# Patient Record
Sex: Male | Born: 1965 | Race: Black or African American | Hispanic: No | Marital: Single | Smoking: Never smoker
Health system: Southern US, Community
[De-identification: ages and names within clinical notes are randomized; demographics above are authoritative.]

## PROBLEM LIST (undated history)

## (undated) DIAGNOSIS — M1990 Unspecified osteoarthritis, unspecified site: Secondary | ICD-10-CM

## (undated) DIAGNOSIS — J45909 Unspecified asthma, uncomplicated: Secondary | ICD-10-CM

---

## 1994-05-16 HISTORY — PX: ANTERIOR CERVICAL DECOMP/DISCECTOMY FUSION: SHX1161

## 1997-09-22 ENCOUNTER — Ambulatory Visit (HOSPITAL_COMMUNITY): Admission: RE | Admit: 1997-09-22 | Discharge: 1997-09-22 | Payer: Self-pay | Admitting: *Deleted

## 1997-09-24 ENCOUNTER — Inpatient Hospital Stay (HOSPITAL_COMMUNITY): Admission: RE | Admit: 1997-09-24 | Discharge: 1997-09-29 | Payer: Self-pay | Admitting: Neurosurgery

## 1998-12-12 ENCOUNTER — Encounter: Payer: Self-pay | Admitting: Neurosurgery

## 1998-12-12 ENCOUNTER — Ambulatory Visit (HOSPITAL_COMMUNITY): Admission: RE | Admit: 1998-12-12 | Discharge: 1998-12-12 | Payer: Self-pay | Admitting: Neurosurgery

## 2016-02-24 DIAGNOSIS — J45909 Unspecified asthma, uncomplicated: Secondary | ICD-10-CM | POA: Diagnosis not present

## 2016-02-24 DIAGNOSIS — R0602 Shortness of breath: Secondary | ICD-10-CM | POA: Diagnosis not present

## 2016-02-24 DIAGNOSIS — I10 Essential (primary) hypertension: Secondary | ICD-10-CM | POA: Diagnosis not present

## 2016-02-24 DIAGNOSIS — R05 Cough: Secondary | ICD-10-CM | POA: Diagnosis not present

## 2016-02-24 DIAGNOSIS — R0902 Hypoxemia: Secondary | ICD-10-CM | POA: Diagnosis not present

## 2016-02-24 DIAGNOSIS — G8220 Paraplegia, unspecified: Secondary | ICD-10-CM | POA: Diagnosis not present

## 2016-02-24 DIAGNOSIS — E099 Drug or chemical induced diabetes mellitus without complications: Secondary | ICD-10-CM | POA: Diagnosis not present

## 2016-02-24 DIAGNOSIS — S12400S Unspecified displaced fracture of fifth cervical vertebra, sequela: Secondary | ICD-10-CM | POA: Diagnosis not present

## 2016-02-24 DIAGNOSIS — Z7722 Contact with and (suspected) exposure to environmental tobacco smoke (acute) (chronic): Secondary | ICD-10-CM | POA: Diagnosis not present

## 2016-02-24 DIAGNOSIS — R079 Chest pain, unspecified: Secondary | ICD-10-CM | POA: Diagnosis not present

## 2016-02-24 DIAGNOSIS — J45901 Unspecified asthma with (acute) exacerbation: Secondary | ICD-10-CM | POA: Diagnosis not present

## 2016-02-25 DIAGNOSIS — J45998 Other asthma: Secondary | ICD-10-CM | POA: Diagnosis not present

## 2016-02-26 DIAGNOSIS — R000 Tachycardia, unspecified: Secondary | ICD-10-CM | POA: Diagnosis not present

## 2016-02-26 DIAGNOSIS — J45909 Unspecified asthma, uncomplicated: Secondary | ICD-10-CM | POA: Diagnosis not present

## 2016-02-26 DIAGNOSIS — Z23 Encounter for immunization: Secondary | ICD-10-CM | POA: Diagnosis not present

## 2016-02-26 DIAGNOSIS — R0603 Acute respiratory distress: Secondary | ICD-10-CM | POA: Diagnosis not present

## 2016-02-26 DIAGNOSIS — J45901 Unspecified asthma with (acute) exacerbation: Secondary | ICD-10-CM | POA: Diagnosis present

## 2016-02-26 DIAGNOSIS — T380X5A Adverse effect of glucocorticoids and synthetic analogues, initial encounter: Secondary | ICD-10-CM | POA: Diagnosis not present

## 2016-02-26 DIAGNOSIS — J45998 Other asthma: Secondary | ICD-10-CM | POA: Diagnosis not present

## 2016-02-26 DIAGNOSIS — R0902 Hypoxemia: Secondary | ICD-10-CM | POA: Diagnosis present

## 2016-02-26 DIAGNOSIS — S12400S Unspecified displaced fracture of fifth cervical vertebra, sequela: Secondary | ICD-10-CM | POA: Diagnosis not present

## 2016-02-26 DIAGNOSIS — E099 Drug or chemical induced diabetes mellitus without complications: Secondary | ICD-10-CM | POA: Diagnosis not present

## 2016-02-26 DIAGNOSIS — Z7722 Contact with and (suspected) exposure to environmental tobacco smoke (acute) (chronic): Secondary | ICD-10-CM | POA: Diagnosis present

## 2016-02-26 DIAGNOSIS — G8220 Paraplegia, unspecified: Secondary | ICD-10-CM | POA: Diagnosis present

## 2016-03-11 DIAGNOSIS — J45909 Unspecified asthma, uncomplicated: Secondary | ICD-10-CM | POA: Diagnosis not present

## 2016-03-11 DIAGNOSIS — R739 Hyperglycemia, unspecified: Secondary | ICD-10-CM | POA: Diagnosis not present

## 2016-03-11 DIAGNOSIS — T380X5A Adverse effect of glucocorticoids and synthetic analogues, initial encounter: Secondary | ICD-10-CM | POA: Diagnosis not present

## 2016-03-11 DIAGNOSIS — Z299 Encounter for prophylactic measures, unspecified: Secondary | ICD-10-CM | POA: Diagnosis not present

## 2016-03-11 DIAGNOSIS — M5030 Other cervical disc degeneration, unspecified cervical region: Secondary | ICD-10-CM | POA: Diagnosis not present

## 2016-03-25 DIAGNOSIS — Z1211 Encounter for screening for malignant neoplasm of colon: Secondary | ICD-10-CM | POA: Diagnosis not present

## 2016-03-25 DIAGNOSIS — Z299 Encounter for prophylactic measures, unspecified: Secondary | ICD-10-CM | POA: Diagnosis not present

## 2016-03-25 DIAGNOSIS — Z1389 Encounter for screening for other disorder: Secondary | ICD-10-CM | POA: Diagnosis not present

## 2016-03-25 DIAGNOSIS — Z0000 Encounter for general adult medical examination without abnormal findings: Secondary | ICD-10-CM | POA: Diagnosis not present

## 2016-03-25 DIAGNOSIS — Z7189 Other specified counseling: Secondary | ICD-10-CM | POA: Diagnosis not present

## 2016-06-20 DIAGNOSIS — Z87828 Personal history of other (healed) physical injury and trauma: Secondary | ICD-10-CM | POA: Diagnosis not present

## 2016-06-20 DIAGNOSIS — R0602 Shortness of breath: Secondary | ICD-10-CM | POA: Diagnosis not present

## 2016-06-20 DIAGNOSIS — J9801 Acute bronchospasm: Secondary | ICD-10-CM | POA: Diagnosis not present

## 2016-06-20 DIAGNOSIS — Z794 Long term (current) use of insulin: Secondary | ICD-10-CM | POA: Diagnosis not present

## 2016-09-22 DIAGNOSIS — Z789 Other specified health status: Secondary | ICD-10-CM | POA: Diagnosis not present

## 2016-09-22 DIAGNOSIS — Z713 Dietary counseling and surveillance: Secondary | ICD-10-CM | POA: Diagnosis not present

## 2016-09-22 DIAGNOSIS — Z299 Encounter for prophylactic measures, unspecified: Secondary | ICD-10-CM | POA: Diagnosis not present

## 2016-09-22 DIAGNOSIS — Z6825 Body mass index (BMI) 25.0-25.9, adult: Secondary | ICD-10-CM | POA: Diagnosis not present

## 2016-09-22 DIAGNOSIS — J45909 Unspecified asthma, uncomplicated: Secondary | ICD-10-CM | POA: Diagnosis not present

## 2016-12-06 DIAGNOSIS — J40 Bronchitis, not specified as acute or chronic: Secondary | ICD-10-CM | POA: Diagnosis not present

## 2016-12-06 DIAGNOSIS — J4541 Moderate persistent asthma with (acute) exacerbation: Secondary | ICD-10-CM | POA: Diagnosis not present

## 2016-12-06 DIAGNOSIS — J209 Acute bronchitis, unspecified: Secondary | ICD-10-CM | POA: Diagnosis not present

## 2016-12-06 DIAGNOSIS — R05 Cough: Secondary | ICD-10-CM | POA: Diagnosis not present

## 2016-12-06 DIAGNOSIS — J45901 Unspecified asthma with (acute) exacerbation: Secondary | ICD-10-CM | POA: Diagnosis not present

## 2017-04-07 DIAGNOSIS — R5383 Other fatigue: Secondary | ICD-10-CM | POA: Diagnosis not present

## 2017-04-07 DIAGNOSIS — Z1339 Encounter for screening examination for other mental health and behavioral disorders: Secondary | ICD-10-CM | POA: Diagnosis not present

## 2017-04-07 DIAGNOSIS — Z1331 Encounter for screening for depression: Secondary | ICD-10-CM | POA: Diagnosis not present

## 2017-04-07 DIAGNOSIS — Z6825 Body mass index (BMI) 25.0-25.9, adult: Secondary | ICD-10-CM | POA: Diagnosis not present

## 2017-04-07 DIAGNOSIS — Z79899 Other long term (current) drug therapy: Secondary | ICD-10-CM | POA: Diagnosis not present

## 2017-04-07 DIAGNOSIS — Z0000 Encounter for general adult medical examination without abnormal findings: Secondary | ICD-10-CM | POA: Diagnosis not present

## 2017-04-07 DIAGNOSIS — Z125 Encounter for screening for malignant neoplasm of prostate: Secondary | ICD-10-CM | POA: Diagnosis not present

## 2017-04-07 DIAGNOSIS — Z1211 Encounter for screening for malignant neoplasm of colon: Secondary | ICD-10-CM | POA: Diagnosis not present

## 2017-04-07 DIAGNOSIS — Z7189 Other specified counseling: Secondary | ICD-10-CM | POA: Diagnosis not present

## 2017-04-07 DIAGNOSIS — Z299 Encounter for prophylactic measures, unspecified: Secondary | ICD-10-CM | POA: Diagnosis not present

## 2017-09-05 DIAGNOSIS — M1710 Unilateral primary osteoarthritis, unspecified knee: Secondary | ICD-10-CM | POA: Diagnosis not present

## 2017-09-05 DIAGNOSIS — Z789 Other specified health status: Secondary | ICD-10-CM | POA: Diagnosis not present

## 2017-09-05 DIAGNOSIS — Z6823 Body mass index (BMI) 23.0-23.9, adult: Secondary | ICD-10-CM | POA: Diagnosis not present

## 2017-09-05 DIAGNOSIS — Z299 Encounter for prophylactic measures, unspecified: Secondary | ICD-10-CM | POA: Diagnosis not present

## 2017-09-05 DIAGNOSIS — J45909 Unspecified asthma, uncomplicated: Secondary | ICD-10-CM | POA: Diagnosis not present

## 2017-12-06 DIAGNOSIS — Z299 Encounter for prophylactic measures, unspecified: Secondary | ICD-10-CM | POA: Diagnosis not present

## 2017-12-06 DIAGNOSIS — M25562 Pain in left knee: Secondary | ICD-10-CM | POA: Diagnosis not present

## 2017-12-06 DIAGNOSIS — Z6822 Body mass index (BMI) 22.0-22.9, adult: Secondary | ICD-10-CM | POA: Diagnosis not present

## 2017-12-20 DIAGNOSIS — J449 Chronic obstructive pulmonary disease, unspecified: Secondary | ICD-10-CM | POA: Diagnosis not present

## 2017-12-20 DIAGNOSIS — Z6822 Body mass index (BMI) 22.0-22.9, adult: Secondary | ICD-10-CM | POA: Diagnosis not present

## 2017-12-20 DIAGNOSIS — Z299 Encounter for prophylactic measures, unspecified: Secondary | ICD-10-CM | POA: Diagnosis not present

## 2017-12-20 DIAGNOSIS — Z713 Dietary counseling and surveillance: Secondary | ICD-10-CM | POA: Diagnosis not present

## 2017-12-20 DIAGNOSIS — M25561 Pain in right knee: Secondary | ICD-10-CM | POA: Diagnosis not present

## 2018-01-03 DIAGNOSIS — M1711 Unilateral primary osteoarthritis, right knee: Secondary | ICD-10-CM | POA: Diagnosis not present

## 2018-01-03 DIAGNOSIS — M25561 Pain in right knee: Secondary | ICD-10-CM | POA: Diagnosis not present

## 2018-01-10 DIAGNOSIS — M25561 Pain in right knee: Secondary | ICD-10-CM | POA: Diagnosis not present

## 2018-01-10 DIAGNOSIS — M1711 Unilateral primary osteoarthritis, right knee: Secondary | ICD-10-CM | POA: Diagnosis not present

## 2018-01-17 DIAGNOSIS — M1711 Unilateral primary osteoarthritis, right knee: Secondary | ICD-10-CM | POA: Diagnosis not present

## 2018-01-17 DIAGNOSIS — M25561 Pain in right knee: Secondary | ICD-10-CM | POA: Diagnosis not present

## 2018-01-24 DIAGNOSIS — M25561 Pain in right knee: Secondary | ICD-10-CM | POA: Diagnosis not present

## 2018-01-24 DIAGNOSIS — M1711 Unilateral primary osteoarthritis, right knee: Secondary | ICD-10-CM | POA: Diagnosis not present

## 2018-01-31 DIAGNOSIS — M25561 Pain in right knee: Secondary | ICD-10-CM | POA: Diagnosis not present

## 2018-01-31 DIAGNOSIS — M1711 Unilateral primary osteoarthritis, right knee: Secondary | ICD-10-CM | POA: Diagnosis not present

## 2018-02-14 DIAGNOSIS — M25561 Pain in right knee: Secondary | ICD-10-CM | POA: Diagnosis not present

## 2018-02-14 DIAGNOSIS — M1711 Unilateral primary osteoarthritis, right knee: Secondary | ICD-10-CM | POA: Diagnosis not present

## 2018-02-21 DIAGNOSIS — M25561 Pain in right knee: Secondary | ICD-10-CM | POA: Diagnosis not present

## 2018-04-16 DIAGNOSIS — Z1339 Encounter for screening examination for other mental health and behavioral disorders: Secondary | ICD-10-CM | POA: Diagnosis not present

## 2018-04-16 DIAGNOSIS — Z0000 Encounter for general adult medical examination without abnormal findings: Secondary | ICD-10-CM | POA: Diagnosis not present

## 2018-04-16 DIAGNOSIS — Z299 Encounter for prophylactic measures, unspecified: Secondary | ICD-10-CM | POA: Diagnosis not present

## 2018-04-16 DIAGNOSIS — E7800 Pure hypercholesterolemia, unspecified: Secondary | ICD-10-CM | POA: Diagnosis not present

## 2018-04-16 DIAGNOSIS — Z6822 Body mass index (BMI) 22.0-22.9, adult: Secondary | ICD-10-CM | POA: Diagnosis not present

## 2018-04-16 DIAGNOSIS — R5383 Other fatigue: Secondary | ICD-10-CM | POA: Diagnosis not present

## 2018-04-16 DIAGNOSIS — Z1211 Encounter for screening for malignant neoplasm of colon: Secondary | ICD-10-CM | POA: Diagnosis not present

## 2018-04-16 DIAGNOSIS — J449 Chronic obstructive pulmonary disease, unspecified: Secondary | ICD-10-CM | POA: Diagnosis not present

## 2018-04-16 DIAGNOSIS — Z7189 Other specified counseling: Secondary | ICD-10-CM | POA: Diagnosis not present

## 2018-04-16 DIAGNOSIS — Z1331 Encounter for screening for depression: Secondary | ICD-10-CM | POA: Diagnosis not present

## 2018-04-18 DIAGNOSIS — E7800 Pure hypercholesterolemia, unspecified: Secondary | ICD-10-CM | POA: Diagnosis not present

## 2018-04-18 DIAGNOSIS — Z79899 Other long term (current) drug therapy: Secondary | ICD-10-CM | POA: Diagnosis not present

## 2018-04-18 DIAGNOSIS — R5383 Other fatigue: Secondary | ICD-10-CM | POA: Diagnosis not present

## 2018-04-18 DIAGNOSIS — Z125 Encounter for screening for malignant neoplasm of prostate: Secondary | ICD-10-CM | POA: Diagnosis not present

## 2018-05-22 DIAGNOSIS — M1712 Unilateral primary osteoarthritis, left knee: Secondary | ICD-10-CM | POA: Diagnosis not present

## 2018-05-22 DIAGNOSIS — M25562 Pain in left knee: Secondary | ICD-10-CM | POA: Diagnosis not present

## 2018-05-22 DIAGNOSIS — M170 Bilateral primary osteoarthritis of knee: Secondary | ICD-10-CM | POA: Diagnosis not present

## 2018-05-22 DIAGNOSIS — M25462 Effusion, left knee: Secondary | ICD-10-CM | POA: Diagnosis not present

## 2018-05-22 DIAGNOSIS — M25561 Pain in right knee: Secondary | ICD-10-CM | POA: Diagnosis not present

## 2018-05-28 DIAGNOSIS — M25562 Pain in left knee: Secondary | ICD-10-CM | POA: Diagnosis not present

## 2018-05-28 DIAGNOSIS — M170 Bilateral primary osteoarthritis of knee: Secondary | ICD-10-CM | POA: Diagnosis not present

## 2018-05-28 DIAGNOSIS — M25561 Pain in right knee: Secondary | ICD-10-CM | POA: Diagnosis not present

## 2018-06-05 DIAGNOSIS — M25562 Pain in left knee: Secondary | ICD-10-CM | POA: Diagnosis not present

## 2018-06-05 DIAGNOSIS — M1712 Unilateral primary osteoarthritis, left knee: Secondary | ICD-10-CM | POA: Diagnosis not present

## 2018-06-13 DIAGNOSIS — M1712 Unilateral primary osteoarthritis, left knee: Secondary | ICD-10-CM | POA: Diagnosis not present

## 2018-06-13 DIAGNOSIS — M25562 Pain in left knee: Secondary | ICD-10-CM | POA: Diagnosis not present

## 2018-06-19 DIAGNOSIS — M1712 Unilateral primary osteoarthritis, left knee: Secondary | ICD-10-CM | POA: Diagnosis not present

## 2018-06-19 DIAGNOSIS — M25562 Pain in left knee: Secondary | ICD-10-CM | POA: Diagnosis not present

## 2018-06-25 DIAGNOSIS — R29898 Other symptoms and signs involving the musculoskeletal system: Secondary | ICD-10-CM | POA: Diagnosis not present

## 2018-06-25 DIAGNOSIS — R269 Unspecified abnormalities of gait and mobility: Secondary | ICD-10-CM | POA: Diagnosis not present

## 2018-06-25 DIAGNOSIS — M62838 Other muscle spasm: Secondary | ICD-10-CM | POA: Diagnosis not present

## 2018-06-25 DIAGNOSIS — M170 Bilateral primary osteoarthritis of knee: Secondary | ICD-10-CM | POA: Diagnosis not present

## 2018-07-16 ENCOUNTER — Ambulatory Visit (INDEPENDENT_AMBULATORY_CARE_PROVIDER_SITE_OTHER): Payer: Medicare Other | Admitting: Orthopaedic Surgery

## 2018-07-16 ENCOUNTER — Ambulatory Visit (INDEPENDENT_AMBULATORY_CARE_PROVIDER_SITE_OTHER): Payer: Medicare Other

## 2018-07-16 DIAGNOSIS — M1711 Unilateral primary osteoarthritis, right knee: Secondary | ICD-10-CM | POA: Diagnosis not present

## 2018-07-16 DIAGNOSIS — M1712 Unilateral primary osteoarthritis, left knee: Secondary | ICD-10-CM

## 2018-07-16 DIAGNOSIS — M1611 Unilateral primary osteoarthritis, right hip: Secondary | ICD-10-CM | POA: Diagnosis not present

## 2018-07-16 DIAGNOSIS — M25551 Pain in right hip: Secondary | ICD-10-CM

## 2018-07-16 NOTE — Progress Notes (Signed)
Office Visit Note   Patient: [name]           Date of Birth: 07-27-1965           MRN: [id number] Visit Date: 07/16/2018              Requested by: No referring provider defined for this encounter. PCP: Patient, No Pcp Per   Assessment & Plan: Visit Diagnoses:  1. Pain in right hip   2. Unilateral primary osteoarthritis, left knee   3. Unilateral primary osteoarthritis, right knee   4. Unilateral primary osteoarthritis, right hip     Plan: I do feel that his arthritis is so severe in his right hip that is keeping him from fully extending his right knee and is becoming contracture and contracted due to this.  Given the severity of the arthritis of his right hip I am recommending a hip replacement surgery and not do anything for the knees for now so we can get him over this hip.  I feel that this will help the knee calm down significantly.  X-rays that he brought with him from his knees do show arthritic changes in the knees but the hip is so severe I feel that this is the next step for him.  I explained in detail what the surgery involves and gave him a handout about this.  We had a long and thorough discussion about the risk and benefits of surgery as well as what the intraoperative and postoperative course was involved.  He is agreeable to having this set up in the near future and I do feel this is medically necessary and warranted at this standpoint.  Follow-Up Instructions: Return for 2 weeks post-op.   Orders:  Orders Placed This Encounter  Procedures  . XR Pelvis 1-2 Views   No orders of the defined types were placed in this encounter.     Procedures: No procedures performed   Clinical Data: No additional findings.   Subjective: Chief Complaint  Patient presents with  . Left Knee - Pain  Patient is a very pleasant 53 year old gentleman who was sent to me from a friend to evaluate bilateral knee pain and osteoarthritis.  He has had multiple injections in  his knees for over 2 years now.  He already walks now with a rolling walker and has severe stiffness in both knees and pain.  He said his right knee can even straighten out completely.  He denies any hip pain or groin pain.  He walks with a significant Trendelenburg gait and is using his walker which is a rolling walker to really hold him up.  He is a very thin individual as well and he denies any medical problems or issues at all.  This is been a slow process over time.  He is not a diabetic.  HPI  Review of Systems He currently denies any headache, chest pain, shortness of breath, fever, chills, nausea, vomiting  Objective: Vital Signs: There were no vitals taken for this visit.  Physical Exam He is alert and orient x3 and in no acute distress Ortho Exam Examination of both knees show significant stiffness of both knees with no effusion in both knees very thin.  His left hip exam is normal but the right hip I cannot even rotate the hip at all and is almost as if it is in the flexed or locked position.  I think this is affecting his knee causing him to keep his knee flexed  to take pressure off of his hip.  When I do manipulate the hip it causes severe groin pain. Specialty Comments:  No specialty comments available.  Imaging: Xr Pelvis 1-2 Views  Result Date: 07/16/2018 An AP pelvis shows severe and profound end-stage arthritis of the right hip.  There is complete loss of joint space.  There is cystic changes in the femoral head and acetabulum.  There is shortening of the hip comparing the hip to the left side which the left side is normal-appearing.    PMFS History: Patient Active Problem List   Diagnosis Date Noted  . Unilateral primary osteoarthritis, right hip 07/16/2018   No past medical history on file.  No family history on file.   Social History   Occupational History  . Not on file  Tobacco Use  . Smoking status: Not on file  Substance and Sexual Activity  . Alcohol  use: Not on file  . Drug use: Not on file  . Sexual activity: Not on file

## 2018-08-14 ENCOUNTER — Ambulatory Visit: Payer: Medicare Other | Admitting: Neurology

## 2018-10-01 ENCOUNTER — Other Ambulatory Visit: Payer: Self-pay | Admitting: Physician Assistant

## 2018-10-03 NOTE — Progress Notes (Signed)
SPOKE W/  _Patient     SCREENING SYMPTOMS OF COVID 19:   COUGH--no  RUNNY NOSE--- no  SORE THROAT---no  NASAL CONGESTION----no  SNEEZING----no  SHORTNESS OF BREATH---no  DIFFICULTY BREATHING---no  TEMP >100.0 -----no  UNEXPLAINED BODY ACHES------no  CHILLS -------- no  HEADACHES ---------no  LOSS OF SMELL/ TASTE --------no    HAVE YOU OR ANY FAMILY MEMBER TRAVELLED PAST 14 DAYS OUT OF THE   COUNTY---no STATE----no COUNTRY----no  HAVE YOU OR ANY FAMILY MEMBER BEEN EXPOSED TO ANYONE WITH COVID 19? no    

## 2018-10-03 NOTE — Patient Instructions (Signed)
[name]   Your procedure is scheduled on: 5.29/20    Report to [facility] Main  Entrance Report to admitting at 6:15 AM.  YOU NEED TO HAVE A COVID 19 TEST ON_Tues. 5/26 at 11:00______, THIS TEST MUST BE DONE BEFORE SURGERY , COME TO [facility] ENTRANCE  ON YOUR COVID TEST DATE.   Call this number if you have problems the morning of surgery [phone]    Remember: Do not eat food after Midnight.             BRUSH YOUR TEETH MORNING OF SURGERY AND RINSE YOUR MOUTH OUT, NO CHEWING GUM CANDY OR MINTS.  Do not eat food After Midnight.  YOU MAY HAVE CLEAR LIQUIDS FROM MIDNIGHT UNTIL 4:30AM.  At 4:30AM Please finish the prescribed Pre-Surgery Gatorade drink. Nothing by mouth after you finish the Gatorade drink !   Take these medicines the morning of surgery with A SIP OF WATER: no medications the am of surgery.               You may use your Albuterol if needed. Bring your inhaler with you to the hospital.                                You may not have any metal on your body including piercings.         Do not wear jewelry,, lotions, powders , deodorant            .              Men may shave face and neck. [facility] - Preparing for Surgery Before surgery, you can play an important role.  Because skin is not sterile, your skin needs to be as free of germs as possible.  You can reduce the number of germs on your skin by washing with CHG (chlorahexidine gluconate) soap before surgery.  CHG is an antiseptic cleaner which kills germs and bonds with the skin to continue killing germs even after washing. Please DO NOT use if you have an allergy to CHG or antibacterial soaps.  If your skin becomes reddened/irritated stop using the CHG and inform your nurse when you arrive at Short Stay. Do not shave (including legs and underarms) for at least 48 hours prior to the first CHG shower.  You may shave your face/neck. Please follow  these instructions carefully:  1.  Shower with CHG Soap the night before surgery and the  morning of Surgery.  2.  If you choose to wash your hair, wash your hair first as usual with your  normal  shampoo.  3.  After you shampoo, rinse your hair and body thoroughly to remove the  shampoo.                                        4.  Use CHG as you would any other liquid soap.  You can apply chg directly  to the skin and wash                       Gently with a scrungie or clean washcloth.  5.  Apply the CHG Soap to your body ONLY FROM THE  NECK DOWN.   Do not use on face/ open                           Wound or open sores. Avoid contact with eyes, ears mouth and genitals (private parts).                       Wash face,  Genitals (private parts) with your normal soap.             6.  Wash thoroughly, paying special attention to the area where your surgery  will be performed.  7.  Thoroughly rinse your body with warm water from the neck down.  8.  DO NOT shower/wash with your normal soap after using and rinsing off  the CHG Soap.                9.  Pat yourself dry with a clean towel.            10.  Wear clean pajamas.            11.  Place clean sheets on your bed the night of your first shower and do not  sleep with pets.  Day of Surgery : Do not apply any lotions/deodorants the morning of surgery.  Please wear clean clothes to the hospital/surgery center.   Incentive Spirometer  An incentive spirometer is a tool that can help keep your lungs clear and active. This tool measures how well you are filling your lungs with each breath. Taking long deep breaths may help reverse or decrease the chance of developing breathing (pulmonary) problems (especially infection) following:  A long period of time when you are unable to move or be active. BEFORE THE PROCEDURE   If the spirometer includes an indicator to show your best effort, your nurse or respiratory therapist will set it to a desired  goal.  If possible, sit up straight or lean slightly forward. Try not to slouch.  Hold the incentive spirometer in an upright position. INSTRUCTIONS FOR USE  1. Sit on the edge of your bed if possible, or sit up as far as you can in bed or on a chair. 2. Hold the incentive spirometer in an upright position. 3. Breathe out normally. 4. Place the mouthpiece in your mouth and seal your lips tightly around it. 5. Breathe in slowly and as deeply as possible, raising the piston or the ball toward the top of the column. 6. Hold your breath for 3-5 seconds or for as long as possible. Allow the piston or ball to fall to the bottom of the column. 7. Remove the mouthpiece from your mouth and breathe out normally. 8. Rest for a few seconds and repeat Steps 1 through 7 at least 10 times every 1-2 hours when you are awake. Take your time and take a few normal breaths between deep breaths. 9. The spirometer may include an indicator to show your best effort. Use the indicator as a goal to work toward during each repetition. 10. After each set of 10 deep breaths, practice coughing to be sure your lungs are clear. If you have an incision (the cut made at the time of surgery), support your incision when coughing by placing a pillow or rolled up towels firmly against it. Once you are able to get out of bed, walk around indoors and cough well. You may stop using the incentive spirometer when instructed  by your caregiver.  RISKS AND COMPLICATIONS  Take your time so you do not get dizzy or light-headed.  If you are in pain, you may need to take or ask for pain medication before doing incentive spirometry. It is harder to take a deep breath if you are having pain. AFTER USE  Rest and breathe slowly and easily.  It can be helpful to keep track of a log of your progress. Your caregiver can provide you with a simple table to help with this. If you are using the spirometer at home, follow these instructions: SEEK  MEDICAL CARE IF:   You are having difficultly using the spirometer.  You have trouble using the spirometer as often as instructed.  Your pain medication is not giving enough relief while using the spirometer.  You develop fever of 100.5 F (38.1 C) or higher. SEEK IMMEDIATE MEDICAL CARE IF:   You cough up bloody sputum that had not been present before.  You develop fever of 102 F (38.9 C) or greater.  You develop worsening pain at or near the incision site. MAKE SURE YOU:   Understand these instructions.  Will watch your condition.  Will get help right away if you are not doing well or get worse. Document Released: 09/12/2006 Document Revised: 07/25/2011 Document Reviewed: 11/13/2006 ExitCare Patient Information 2014 ExitCare, [place].   ________________________________________________________________________    Special Instructions: N/ABring only the essentials with you.              Please read over the following fact sheets you were given: _____________________________________________________________________

## 2018-10-05 ENCOUNTER — Telehealth: Payer: Self-pay

## 2018-10-05 ENCOUNTER — Telehealth: Payer: Self-pay | Admitting: *Deleted

## 2018-10-05 ENCOUNTER — Encounter (HOSPITAL_COMMUNITY): Payer: Self-pay

## 2018-10-05 ENCOUNTER — Other Ambulatory Visit: Payer: Self-pay

## 2018-10-05 ENCOUNTER — Ambulatory Visit
Admission: EM | Admit: 2018-10-05 | Discharge: 2018-10-05 | Disposition: A | Discharge status: Home / Self Care | Payer: Medicare Other | Attending: Emergency Medicine | Admitting: Emergency Medicine

## 2018-10-05 ENCOUNTER — Encounter (HOSPITAL_COMMUNITY)
Admission: RE | Admit: 2018-10-05 | Discharge: 2018-10-05 | Disposition: A | Discharge status: Home / Self Care | Payer: Medicare Other | Source: Ambulatory Visit | Attending: Orthopaedic Surgery | Admitting: Orthopaedic Surgery

## 2018-10-05 ENCOUNTER — Encounter (HOSPITAL_COMMUNITY): Payer: Self-pay | Admitting: Anesthesiology

## 2018-10-05 ENCOUNTER — Other Ambulatory Visit: Payer: Medicare Other

## 2018-10-05 DIAGNOSIS — Z20822 Contact with and (suspected) exposure to covid-19: Secondary | ICD-10-CM

## 2018-10-05 DIAGNOSIS — Z20828 Contact with and (suspected) exposure to other viral communicable diseases: Secondary | ICD-10-CM

## 2018-10-05 DIAGNOSIS — R05 Cough: Secondary | ICD-10-CM

## 2018-10-05 DIAGNOSIS — R509 Fever, unspecified: Secondary | ICD-10-CM

## 2018-10-05 DIAGNOSIS — R6889 Other general symptoms and signs: Secondary | ICD-10-CM

## 2018-10-05 DIAGNOSIS — M1611 Unilateral primary osteoarthritis, right hip: Secondary | ICD-10-CM

## 2018-10-05 MED ORDER — BENZONATATE 100 MG PO CAPS: 100.0000 mg | ORAL_CAPSULE | Freq: Three times a day (TID) | ORAL | 0 refills | Status: DC

## 2018-10-05 MED ORDER — ACETAMINOPHEN 325 MG PO TABS
975.0000 mg | ORAL_TABLET | Freq: Once | ORAL | Status: AC
  Administered 2018-10-05: 975 mg via ORAL

## 2018-10-05 NOTE — ED Triage Notes (Signed)
Pt has chronic cough but when went for pre op today he was noted to have fever as well, pt wishes to be evaluated further

## 2018-10-05 NOTE — Telephone Encounter (Signed)
[facility] requesting COVID 19 testing - pt. Symptomatic.

## 2018-10-05 NOTE — Care Plan (Signed)
RNCM contacted patient and discussed upcoming surgery scheduled for 10/12/18 with Dr. Magnus Ivan for a Right Ant. THA. Patient reports he lives with his sister, who will be assisting him after surgery. Reviewed all information related to upcoming surgery including participation in the Ortho Bundle program. He verbalized he already has a rolling walker. Anticipate HHPT will be needed post-op. Patient verbalized he was seen for his pre-op today with [facility] and had COVID-19 testing done at [facility] due to a cough/fever later in the morning. RNCM did not see this in pre-op information in chart. Will monitor symptoms and results of testing prior to surgery on Friday, 10/12/18. Reviewed and answered all questions related to anticipated surgery. Reviewed [facility] Bundle Pre-qualification questionnaire as well as [place]os, Montez Hageman. Survey.

## 2018-10-05 NOTE — Discharge Instructions (Signed)
Tylenol given in office COVID testing ordered.   Your appointment is at 11:45 today.  You will pull up to the testing site and remain in your vehicle.    In the meantime: You should remain isolated in your home for 7 days from symptom onset AND greater than 72 hours after symptoms resolution (absence of fever without the use of fever-reducing medication and improvement in respiratory symptoms), whichever is longer Get plenty of rest and push fluids You may use OTC zyrtec and/or flonase as needed for congestion and/ or runny nose Take OTC tylenol as needed for fever, body aches, and/or chills Tessalon Perles prescribed for cough Follow up with PCP via telephone or televisit for recheck next week to ensure your symptoms are improving Call or go to the ED if you have any new or worsening symptoms such as fever, worsening cough, shortness of breath, chest tightness, chest pain, turning blue, changes in mental status, etc...  Testing site: [facility] [address] [place] [address] From [facility] ED

## 2018-10-05 NOTE — ED Provider Notes (Signed)
[facility]   [id number] 10/05/18 Arrival Time: 1007  Cc: COUGH  SUBJECTIVE:  [name] is a 53 y.o. male who presents with hx of chronic cough with white sputum x 2-3 years, and recorded fever today.  Went to pre-op appointment earlier for hip replacement, and had a fever of 100.  100.9 in office.  Sister with positive exposure to COVID-19 at work.   Has NOT tried OTC medications.  Denies previous symptoms in the past. Denies hx of bronchitis, pneumonia, or tobacco use   Denies chills, fatigue, sinus pain, rhinorrhea, congestion, sore throat, SOB, wheezing, chest pain, chest pressure, nausea, vomiting, changes in bowel or bladder habits.    ROS: As per HPI.  History reviewed. No pertinent past medical history. History reviewed. No pertinent surgical history. No Known Allergies No current facility-administered medications on file prior to encounter.    Current Outpatient Medications on File Prior to Encounter  Medication Sig Dispense Refill  . albuterol (PROVENTIL) (2.5 MG/3ML) 0.083% nebulizer solution Inhale 2.5 mg into the lungs 4 (four) times daily as needed for wheezing or shortness of breath.       Social History   Socioeconomic History  . Marital status: Single    Spouse name: Not on file  . Number of children: Not on file  . Years of education: Not on file  . Highest education level: Not on file  Occupational History  . Not on file  Social Needs  . Financial resource strain: Not on file  . Food insecurity:    Worry: Not on file    Inability: Not on file  . Transportation needs:    Medical: Not on file    Non-medical: Not on file  Tobacco Use  . Smoking status: Never Smoker  . Smokeless tobacco: Never Used  Substance and Sexual Activity  . Alcohol use: Not on file  . Drug use: Not on file  . Sexual activity: Not on file  Lifestyle  . Physical activity:    Days per week: Not on file    Minutes per session: Not on file  . Stress: Not on file   Relationships  . Social connections:    Talks on phone: Not on file    Gets together: Not on file    Attends religious service: Not on file    Active member of club or organization: Not on file    Attends meetings of clubs or organizations: Not on file    Relationship status: Not on file  . Intimate partner violence:    Fear of current or ex partner: Not on file    Emotionally abused: Not on file    Physically abused: Not on file    Forced sexual activity: Not on file  Other Topics Concern  . Not on file  Social History Narrative  . Not on file   Family History  Problem Relation Age of Onset  . Diabetes Mother      OBJECTIVE:  Vitals:   10/05/18 1026  BP: 124/73  Pulse: (!) 118  Resp: 20  Temp: (!) 100.9 F (38.3 C)  TempSrc: Oral  SpO2: 95%     General appearance: Alert, appears mildly fatigued, but nontoxic; speaking in full sentences without difficulty HEENT:NCAT; Ears: EACs clear, TMs pearly gray; Eyes: PERRL.  EOM grossly intact. Nose: nares patent with mild rhinorrhea; Throat: tonsils nonerythematous or enlarged, uvula midline  Neck: supple without LAD Lungs: clear to auscultation bilaterally without adventitious breath sounds; normal respiratory  effort; cough absent Heart: tachycardic.  Radial pulses 2+ symmetrical bilaterally Skin: warm and dry Psychological: alert and cooperative; normal mood and affect  ASSESSMENT & PLAN:  1. Suspected Covid-19 Virus Infection     Meds ordered this encounter  Medications  . acetaminophen (TYLENOL) tablet 975 mg  . benzonatate (TESSALON) 100 MG capsule    Sig: Take 1 capsule (100 mg total) by mouth every 8 (eight) hours.    Dispense:  21 capsule    Refill:  0    Order Specific Question:   Supervising Provider    Answer:   Eustace MooreELSON, YVONNE SUE [[id number]]    Tylenol given in office COVID testing ordered.   Your appointment is at 11:45 today.  You will pull up to the testing site and remain in your vehicle.    In  the meantime: You should remain isolated in your home for 7 days from symptom onset AND greater than 72 hours after symptoms resolution (absence of fever without the use of fever-reducing medication and improvement in respiratory symptoms), whichever is longer Get plenty of rest and push fluids You may use OTC zyrtec and/or flonase as needed for congestion and/ or runny nose Take OTC tylenol as needed for fever, body aches, and/or chills Tessalon Perles prescribed for cough Follow up with PCP via telephone or televisit for recheck next week to ensure your symptoms are improving Call or go to the ED if you have any new or worsening symptoms such as fever, worsening cough, shortness of breath, chest tightness, chest pain, turning blue, changes in mental status, etc...  Reviewed expectations re: course of current medical issues. Questions answered. Outlined signs and symptoms indicating need for more acute intervention. Patient verbalized understanding. After Visit Summary given.          Rennis HardingWurst, Maalik Pinn, PA-C 10/05/18 1116

## 2018-10-05 NOTE — Telephone Encounter (Signed)
Ortho Bundle Pre-op Call completed.

## 2018-10-05 NOTE — Anesthesia Preprocedure Evaluation (Deleted)
Anesthesia Evaluation Anesthesia Physical Anesthesia Plan Anesthesia Quick Evaluation  

## 2018-10-09 ENCOUNTER — Other Ambulatory Visit (HOSPITAL_COMMUNITY)
Admission: RE | Admit: 2018-10-09 | Discharge: 2018-10-09 | Disposition: A | Discharge status: Home / Self Care | Payer: Medicare Other | Source: Ambulatory Visit | Attending: Orthopaedic Surgery | Admitting: Orthopaedic Surgery

## 2018-10-09 ENCOUNTER — Other Ambulatory Visit: Payer: Self-pay

## 2018-10-09 DIAGNOSIS — Z1159 Encounter for screening for other viral diseases: Secondary | ICD-10-CM | POA: Diagnosis not present

## 2018-10-09 LAB — NOVEL CORONAVIRUS, NAA: SARS-CoV-2, NAA: NOT DETECTED

## 2018-10-10 ENCOUNTER — Telehealth: Payer: Self-pay | Admitting: *Deleted

## 2018-10-10 LAB — NOVEL CORONAVIRUS, NAA (HOSP ORDER, SEND-OUT TO REF LAB; TAT 18-24 HRS): SARS-CoV-2, NAA: NOT DETECTED

## 2018-10-11 ENCOUNTER — Other Ambulatory Visit: Payer: Self-pay

## 2018-10-11 DIAGNOSIS — J45909 Unspecified asthma, uncomplicated: Secondary | ICD-10-CM | POA: Diagnosis not present

## 2018-10-11 DIAGNOSIS — R509 Fever, unspecified: Secondary | ICD-10-CM | POA: Diagnosis not present

## 2018-10-11 DIAGNOSIS — Z299 Encounter for prophylactic measures, unspecified: Secondary | ICD-10-CM | POA: Diagnosis not present

## 2018-10-11 DIAGNOSIS — J449 Chronic obstructive pulmonary disease, unspecified: Secondary | ICD-10-CM | POA: Diagnosis not present

## 2018-10-12 ENCOUNTER — Ambulatory Visit (HOSPITAL_COMMUNITY): Admission: RE | Admit: 2018-10-12 | Payer: Medicare Other | Source: Home / Self Care | Admitting: Orthopaedic Surgery

## 2018-10-12 ENCOUNTER — Encounter (HOSPITAL_COMMUNITY): Admission: RE | Payer: Self-pay | Source: Home / Self Care

## 2018-10-12 HISTORY — DX: Unspecified osteoarthritis, unspecified site: M19.90

## 2018-10-12 SURGERY — ARTHROPLASTY, HIP, TOTAL, ANTERIOR APPROACH
Anesthesia: Choice | Laterality: Right

## 2018-11-09 ENCOUNTER — Telehealth: Payer: Self-pay | Admitting: Orthopaedic Surgery

## 2018-11-09 NOTE — Telephone Encounter (Signed)
Patient called in stating that all his levels were back to normal from reason his surgery was cancelled. He would like it if someone would give him a call letting him know when he could possible go for surgery again

## 2018-11-15 NOTE — Telephone Encounter (Signed)
Called patient and rescheduled.

## 2018-11-26 ENCOUNTER — Other Ambulatory Visit: Payer: Self-pay | Admitting: Physician Assistant

## 2018-11-29 DIAGNOSIS — J45909 Unspecified asthma, uncomplicated: Secondary | ICD-10-CM | POA: Diagnosis not present

## 2018-11-29 DIAGNOSIS — J449 Chronic obstructive pulmonary disease, unspecified: Secondary | ICD-10-CM | POA: Diagnosis not present

## 2018-11-29 DIAGNOSIS — Z6822 Body mass index (BMI) 22.0-22.9, adult: Secondary | ICD-10-CM | POA: Diagnosis not present

## 2018-11-29 DIAGNOSIS — Z299 Encounter for prophylactic measures, unspecified: Secondary | ICD-10-CM | POA: Diagnosis not present

## 2018-11-29 DIAGNOSIS — J45901 Unspecified asthma with (acute) exacerbation: Secondary | ICD-10-CM | POA: Diagnosis not present

## 2018-12-03 ENCOUNTER — Telehealth: Payer: Self-pay | Admitting: *Deleted

## 2018-12-03 NOTE — Care Plan (Signed)
Anticipate HHPT needs post-discharge. RNCM will schedule with [place]- [facility] provided.

## 2018-12-03 NOTE — Care Plan (Signed)
RNCM spoke with patient and reviewed upcoming Pre-admit testing for Covid-19, pre-admission testing at [facility], and scheduled surgery for his Right total hip arthroplasty with Dr. Ninfa Linden on 12/07/18. Patient states he has a family member that will be available to help at home after discharge. He already has a FWW. 2 week post-op appointment scheduled for 12/24/2018 at 1:30 pm with Dr. Ninfa Linden. No changes to history since pre-op conversation back in May of 2020 and cancellation of initial surgery. Cyndee Brightly. Completed in May. Will continue to follow along for any RNCM needs.

## 2018-12-03 NOTE — Telephone Encounter (Signed)
Ortho Bundle Pre-op call completed. 

## 2018-12-04 ENCOUNTER — Other Ambulatory Visit (HOSPITAL_COMMUNITY)
Admission: RE | Admit: 2018-12-04 | Discharge: 2018-12-04 | Disposition: A | Discharge status: Home / Self Care | Payer: Medicare Other | Source: Ambulatory Visit | Attending: Orthopaedic Surgery | Admitting: Orthopaedic Surgery

## 2018-12-04 DIAGNOSIS — Z1159 Encounter for screening for other viral diseases: Secondary | ICD-10-CM | POA: Insufficient documentation

## 2018-12-04 LAB — SARS CORONAVIRUS 2 (TAT 6-24 HRS): SARS Coronavirus 2: NEGATIVE

## 2018-12-04 NOTE — Patient Instructions (Addendum)
DUE TO COVID-19 ONLY ONE VISITOR IS ALLOWED IN THE HOSPITAL AT THIS TIME   COVID SWAB TESTING COMPLETED ON: December 04, 2018  (Must self quarantine after testing. Follow instructions on handout.)   Your procedure is scheduled on: Friday, December 07, 2018   Surgery Time:  12:15PM-1:15PM   Report to [facility]  Entrance    Report to admitting at 9:45 AM   Call this number if you have problems the morning of surgery [phone]   Do not eat food:After Midnight.   May have liquids until 9:15 AM day of surgery   CLEAR LIQUID DIET  Foods Allowed                                                                     Foods Excluded  Water, Black Coffee and tea, regular and decaf                             liquids that you cannot  Plain Jell-O in any flavor                                             see through such as: Fruit ices (not with fruit pulp)                                     milk, soups, orange juice  Iced Popsicles                                    All solid food Carbonated beverages, regular and diet                                    Cranberry, grape and apple juices Sports drinks like Gatorade Lightly seasoned clear broth or consume(fat free) Sugar, honey syrup  Sample Menu Breakfast                                Lunch                                     Supper Cranberry juice                    Beef broth                            Chicken broth Jell-O                                     Grape juice  Apple juice Coffee or tea                        Jell-O                                      Popsicle                                                Coffee or tea                        Coffee or tea   Complete one Ensure drink the morning of surgery at 9:15AM the day of surgery.   Brush your teeth the morning of surgery.     Take these medicines the morning of surgery with A SIP OF WATER: None   Bring Asthma Inhaler day of  surgery                               You may not have any metal on your body including jewelry, and body piercings               Do not wear lotions, powders, cologne, or deodorant                           Men may shave face and neck.   Do not bring valuables to the hospital. [facility] IS NOT             RESPONSIBLE   FOR VALUABLES.   Contacts, dentures or bridgework may not be worn into surgery.   Bring small overnight bag day of surgery.    Special Instructions:              Please read over the following fact sheets you were given:  [facility] Pre[facility] Before surgery, you can play an important role.  Because skin is not sterile, your skin needs to be as free of germs as possible.  You can reduce the number of germs on your skin by washing with CHG (chlorahexidine gluconate) soap before surgery.  CHG is an antiseptic cleaner which kills germs and bonds with the skin to continue killing germs even after washing. Please DO NOT use if you have an allergy to CHG or antibacterial soaps.  If your skin becomes reddened/irritated stop using the CHG and inform your nurse when you arrive at Short Stay. Do not shave (including legs and underarms) for at least 48 hours prior to the first CHG shower.  You may shave your face/neck.  Please follow these instructions carefully:  1.  Shower with CHG Soap the night before surgery and the  morning of surgery.  2.  If you choose to wash your hair, wash your hair first as usual with your normal  shampoo.  3.  After you shampoo, rinse your hair and body thoroughly to remove the shampoo.                             4.  Use CHG as you would any other liquid  soap.  You can apply chg directly to the skin and wash.  Gently with a scrungie or clean washcloth.  5.  Apply the CHG Soap to your body ONLY FROM THE NECK DOWN.   Do   not use on face/ open                           Wound or open sores. Avoid contact with eyes, ears mouth and    genitals (private parts).                       Wash face,  Genitals (private parts) with your normal soap.             6.  Wash thoroughly, paying special attention to the area where your    surgery  will be performed.  7.  Thoroughly rinse your body with warm water from the neck down.  8.  DO NOT shower/wash with your normal soap after using and rinsing off the CHG Soap.                9.  Pat yourself dry with a clean towel.            10.  Wear clean pajamas.            11.  Place clean sheets on your bed the night of your first shower and do not  sleep with pets. Day of Surgery : Do not apply any lotions/deodorants the morning of surgery.  Please wear clean clothes to the hospital/surgery center.  FAILURE TO FOLLOW THESE INSTRUCTIONS MAY RESULT IN THE CANCELLATION OF YOUR SURGERY  PATIENT SIGNATURE_________________________________  NURSE SIGNATURE__________________________________  ________________________________________________________________________   Adam Phenix  An incentive spirometer is a tool that can help keep your lungs clear and active. This tool measures how well you are filling your lungs with each breath. Taking long deep breaths may help reverse or decrease the chance of developing breathing (pulmonary) problems (especially infection) following:  A long period of time when you are unable to move or be active. BEFORE THE PROCEDURE   If the spirometer includes an indicator to show your best effort, your nurse or respiratory therapist will set it to a desired goal.  If possible, sit up straight or lean slightly forward. Try not to slouch.  Hold the incentive spirometer in an upright position. INSTRUCTIONS FOR USE  1. Sit on the edge of your bed if possible, or sit up as far as you can in bed or on a chair. 2. Hold the incentive spirometer in an upright position. 3. Breathe out normally. 4. Place the mouthpiece in your mouth and seal your lips tightly  around it. 5. Breathe in slowly and as deeply as possible, raising the piston or the ball toward the top of the column. 6. Hold your breath for 3-5 seconds or for as long as possible. Allow the piston or ball to fall to the bottom of the column. 7. Remove the mouthpiece from your mouth and breathe out normally. 8. Rest for a few seconds and repeat Steps 1 through 7 at least 10 times every 1-2 hours when you are awake. Take your time and take a few normal breaths between deep breaths. 9. The spirometer may include an indicator to show your best effort. Use the indicator as a goal to work toward during each repetition. 10. After each set of 10 deep breaths, practice  coughing to be sure your lungs are clear. If you have an incision (the cut made at the time of surgery), support your incision when coughing by placing a pillow or rolled up towels firmly against it. Once you are able to get out of bed, walk around indoors and cough well. You may stop using the incentive spirometer when instructed by your caregiver.  RISKS AND COMPLICATIONS  Take your time so you do not get dizzy or light-headed.  If you are in pain, you may need to take or ask for pain medication before doing incentive spirometry. It is harder to take a deep breath if you are having pain. AFTER USE  Rest and breathe slowly and easily.  It can be helpful to keep track of a log of your progress. Your caregiver can provide you with a simple table to help with this. If you are using the spirometer at home, follow these instructions: SEEK MEDICAL CARE IF:   You are having difficultly using the spirometer.  You have trouble using the spirometer as often as instructed.  Your pain medication is not giving enough relief while using the spirometer.  You develop fever of 100.5 F (38.1 C) or higher. SEEK IMMEDIATE MEDICAL CARE IF:   You cough up bloody sputum that had not been present before.  You develop fever of 102 F (38.9 C) or  greater.  You develop worsening pain at or near the incision site. MAKE SURE YOU:   Understand these instructions.  Will watch your condition.  Will get help right away if you are not doing well or get worse. Document Released: 09/12/2006 Document Revised: 07/25/2011 Document Reviewed: 11/13/2006 [facility] Patient Information 2014 [employer] [place].   ________________________________________________________________________

## 2018-12-05 ENCOUNTER — Encounter (HOSPITAL_COMMUNITY): Payer: Self-pay

## 2018-12-05 ENCOUNTER — Other Ambulatory Visit: Payer: Self-pay

## 2018-12-05 ENCOUNTER — Encounter (HOSPITAL_COMMUNITY)
Admission: RE | Admit: 2018-12-05 | Discharge: 2018-12-05 | Disposition: A | Discharge status: Home / Self Care | Payer: Medicare Other | Source: Ambulatory Visit | Attending: Orthopaedic Surgery | Admitting: Orthopaedic Surgery

## 2018-12-05 DIAGNOSIS — Z01812 Encounter for preprocedural laboratory examination: Secondary | ICD-10-CM | POA: Insufficient documentation

## 2018-12-05 DIAGNOSIS — M1611 Unilateral primary osteoarthritis, right hip: Secondary | ICD-10-CM | POA: Insufficient documentation

## 2018-12-05 HISTORY — DX: Unspecified asthma, uncomplicated: J45.909

## 2018-12-05 LAB — CBC
HCT: 41.1 % (ref 39.0–52.0)
Hemoglobin: 12.5 g/dL — ABNORMAL LOW (ref 13.0–17.0)
MCH: 28 pg (ref 26.0–34.0)
MCHC: 30.4 g/dL (ref 30.0–36.0)
MCV: 91.9 fL (ref 80.0–100.0)
Platelets: 386 10*3/uL (ref 150–400)
RBC: 4.47 MIL/uL (ref 4.22–5.81)
RDW: 13.2 % (ref 11.5–15.5)
WBC: 13.4 10*3/uL — ABNORMAL HIGH (ref 4.0–10.5)
nRBC: 0 % (ref 0.0–0.2)

## 2018-12-05 LAB — SURGICAL PCR SCREEN
MRSA, PCR: NEGATIVE
Staphylococcus aureus: POSITIVE — AB

## 2018-12-06 NOTE — Progress Notes (Signed)
12-05-18 PCR result routed to Dr. Ninfa Linden for review

## 2018-12-07 ENCOUNTER — Inpatient Hospital Stay (HOSPITAL_COMMUNITY)
Admission: RE | Admit: 2018-12-07 | Discharge: 2018-12-10 | DRG: 470 | Disposition: A | Discharge status: Home Health | Payer: Medicare Other | Attending: Orthopaedic Surgery | Admitting: Orthopaedic Surgery

## 2018-12-07 ENCOUNTER — Ambulatory Visit (HOSPITAL_COMMUNITY): Payer: Medicare Other

## 2018-12-07 ENCOUNTER — Other Ambulatory Visit: Payer: Self-pay

## 2018-12-07 ENCOUNTER — Encounter (HOSPITAL_COMMUNITY)
Admission: RE | Disposition: A | Discharge status: Home / Self Care | Payer: Self-pay | Source: Home / Self Care | Attending: Orthopaedic Surgery

## 2018-12-07 ENCOUNTER — Ambulatory Visit (HOSPITAL_COMMUNITY): Payer: Medicare Other | Admitting: Physician Assistant

## 2018-12-07 ENCOUNTER — Ambulatory Visit (HOSPITAL_COMMUNITY): Payer: Medicare Other | Admitting: Registered Nurse

## 2018-12-07 ENCOUNTER — Encounter (HOSPITAL_COMMUNITY): Payer: Self-pay

## 2018-12-07 ENCOUNTER — Inpatient Hospital Stay (HOSPITAL_COMMUNITY): Payer: Medicare Other

## 2018-12-07 DIAGNOSIS — M1611 Unilateral primary osteoarthritis, right hip: Secondary | ICD-10-CM | POA: Diagnosis present

## 2018-12-07 DIAGNOSIS — J45909 Unspecified asthma, uncomplicated: Secondary | ICD-10-CM | POA: Diagnosis present

## 2018-12-07 DIAGNOSIS — Z79899 Other long term (current) drug therapy: Secondary | ICD-10-CM

## 2018-12-07 DIAGNOSIS — Z96641 Presence of right artificial hip joint: Secondary | ICD-10-CM

## 2018-12-07 DIAGNOSIS — Z471 Aftercare following joint replacement surgery: Secondary | ICD-10-CM | POA: Diagnosis not present

## 2018-12-07 DIAGNOSIS — Z419 Encounter for procedure for purposes other than remedying health state, unspecified: Secondary | ICD-10-CM

## 2018-12-07 DIAGNOSIS — M25551 Pain in right hip: Secondary | ICD-10-CM | POA: Diagnosis not present

## 2018-12-07 HISTORY — PX: TOTAL HIP ARTHROPLASTY: SHX124

## 2018-12-07 SURGERY — ARTHROPLASTY, HIP, TOTAL, ANTERIOR APPROACH
Anesthesia: Spinal | Laterality: Right

## 2018-12-07 MED ORDER — SODIUM CHLORIDE 0.9 % IR SOLN
Status: DC | PRN
  Administered 2018-12-07 (×2): 1000 mL

## 2018-12-07 MED ORDER — PROPOFOL 10 MG/ML IV BOLUS
INTRAVENOUS | Status: DC | PRN
  Administered 2018-12-07: 20 mg via INTRAVENOUS

## 2018-12-07 MED ORDER — DEXAMETHASONE SODIUM PHOSPHATE 10 MG/ML IJ SOLN
INTRAMUSCULAR | Status: DC | PRN
  Administered 2018-12-07: 10 mg via INTRAVENOUS

## 2018-12-07 MED ORDER — ALBUMIN HUMAN 5 % IV SOLN
INTRAVENOUS | Status: AC
  Filled 2018-12-07: qty 500

## 2018-12-07 MED ORDER — ALBUTEROL SULFATE (2.5 MG/3ML) 0.083% IN NEBU
2.5000 mg | INHALATION_SOLUTION | Freq: Four times a day (QID) | RESPIRATORY_TRACT | Status: DC | PRN
  Administered 2018-12-07: 2.5 mg via RESPIRATORY_TRACT
  Filled 2018-12-07: qty 3

## 2018-12-07 MED ORDER — PHENYLEPHRINE HCL (PRESSORS) 10 MG/ML IV SOLN
INTRAVENOUS | Status: AC
  Filled 2018-12-07: qty 1

## 2018-12-07 MED ORDER — HYDROMORPHONE HCL 1 MG/ML IJ SOLN: 0.5000 mg | INTRAMUSCULAR | Status: DC | PRN

## 2018-12-07 MED ORDER — PHENOL 1.4 % MT LIQD: 1.0000 | OROMUCOSAL | Status: DC | PRN

## 2018-12-07 MED ORDER — TRANEXAMIC ACID-NACL 1000-0.7 MG/100ML-% IV SOLN
1000.0000 mg | INTRAVENOUS | Status: AC
  Administered 2018-12-07: 13:00:00 1000 mg via INTRAVENOUS
  Filled 2018-12-07: qty 100

## 2018-12-07 MED ORDER — PROPOFOL 10 MG/ML IV BOLUS
INTRAVENOUS | Status: AC
  Filled 2018-12-07: qty 40

## 2018-12-07 MED ORDER — BUPIVACAINE IN DEXTROSE 0.75-8.25 % IT SOLN
INTRATHECAL | Status: DC | PRN
  Administered 2018-12-07: 1.8 mL via INTRATHECAL

## 2018-12-07 MED ORDER — ASPIRIN 81 MG PO CHEW
81.0000 mg | CHEWABLE_TABLET | Freq: Two times a day (BID) | ORAL | Status: DC
  Administered 2018-12-07 – 2018-12-10 (×6): 81 mg via ORAL
  Filled 2018-12-07 (×6): qty 1

## 2018-12-07 MED ORDER — ONDANSETRON HCL 4 MG/2ML IJ SOLN
INTRAMUSCULAR | Status: DC | PRN
  Administered 2018-12-07: 4 mg via INTRAVENOUS

## 2018-12-07 MED ORDER — OXYCODONE HCL 5 MG PO TABS
5.0000 mg | ORAL_TABLET | ORAL | Status: DC | PRN
  Administered 2018-12-07 (×2): 5 mg via ORAL
  Administered 2018-12-08 – 2018-12-09 (×3): 10 mg via ORAL
  Administered 2018-12-10: 5 mg via ORAL
  Filled 2018-12-07: qty 2
  Filled 2018-12-07 (×3): qty 1
  Filled 2018-12-07 (×5): qty 2

## 2018-12-07 MED ORDER — POVIDONE-IODINE 10 % EX SWAB: 2.0000 "application " | Freq: Once | CUTANEOUS | Status: DC

## 2018-12-07 MED ORDER — LACTATED RINGERS IV SOLN
INTRAVENOUS | Status: DC
  Administered 2018-12-07 (×3): via INTRAVENOUS

## 2018-12-07 MED ORDER — DOCUSATE SODIUM 100 MG PO CAPS
100.0000 mg | ORAL_CAPSULE | Freq: Two times a day (BID) | ORAL | Status: DC
  Administered 2018-12-07 – 2018-12-10 (×6): 100 mg via ORAL
  Filled 2018-12-07 (×6): qty 1

## 2018-12-07 MED ORDER — METHOCARBAMOL 500 MG PO TABS
500.0000 mg | ORAL_TABLET | Freq: Four times a day (QID) | ORAL | Status: DC | PRN
  Administered 2018-12-07 – 2018-12-10 (×6): 500 mg via ORAL
  Filled 2018-12-07 (×7): qty 1

## 2018-12-07 MED ORDER — ALUM & MAG HYDROXIDE-SIMETH 200-200-20 MG/5ML PO SUSP: 30.0000 mL | ORAL | Status: DC | PRN

## 2018-12-07 MED ORDER — SODIUM CHLORIDE 0.9 % IV SOLN
INTRAVENOUS | Status: DC | PRN
  Administered 2018-12-07: 25 ug/min via INTRAVENOUS

## 2018-12-07 MED ORDER — TRANEXAMIC ACID-NACL 1000-0.7 MG/100ML-% IV SOLN: 1000.0000 mg | INTRAVENOUS | Status: DC

## 2018-12-07 MED ORDER — HYDROMORPHONE HCL 1 MG/ML IJ SOLN: 0.2500 mg | INTRAMUSCULAR | Status: DC | PRN

## 2018-12-07 MED ORDER — CEFAZOLIN SODIUM-DEXTROSE 1-4 GM/50ML-% IV SOLN
1.0000 g | Freq: Four times a day (QID) | INTRAVENOUS | Status: AC
  Administered 2018-12-07 – 2018-12-08 (×2): 1 g via INTRAVENOUS
  Filled 2018-12-07 (×2): qty 50

## 2018-12-07 MED ORDER — ONDANSETRON HCL 4 MG PO TABS: 4.0000 mg | ORAL_TABLET | Freq: Four times a day (QID) | ORAL | Status: DC | PRN

## 2018-12-07 MED ORDER — LIDOCAINE HCL (CARDIAC) PF 100 MG/5ML IV SOSY
PREFILLED_SYRINGE | INTRAVENOUS | Status: DC | PRN
  Administered 2018-12-07: 20 mg via INTRAVENOUS

## 2018-12-07 MED ORDER — CHLORHEXIDINE GLUCONATE 4 % EX LIQD: 60.0000 mL | Freq: Once | CUTANEOUS | Status: DC

## 2018-12-07 MED ORDER — POVIDONE-IODINE 10 % EX SWAB
2.0000 "application " | Freq: Once | CUTANEOUS | Status: AC
  Administered 2018-12-07: 2 via TOPICAL

## 2018-12-07 MED ORDER — PROPOFOL 500 MG/50ML IV EMUL
INTRAVENOUS | Status: DC | PRN
  Administered 2018-12-07: 110 ug/kg/min via INTRAVENOUS

## 2018-12-07 MED ORDER — GABAPENTIN 100 MG PO CAPS
100.0000 mg | ORAL_CAPSULE | Freq: Three times a day (TID) | ORAL | Status: DC
  Administered 2018-12-07 – 2018-12-10 (×10): 100 mg via ORAL
  Filled 2018-12-07 (×10): qty 1

## 2018-12-07 MED ORDER — METOCLOPRAMIDE HCL 5 MG/ML IJ SOLN: 5.0000 mg | Freq: Three times a day (TID) | INTRAMUSCULAR | Status: DC | PRN

## 2018-12-07 MED ORDER — DIPHENHYDRAMINE HCL 12.5 MG/5ML PO ELIX: 12.5000 mg | ORAL_SOLUTION | ORAL | Status: DC | PRN

## 2018-12-07 MED ORDER — METHOCARBAMOL 500 MG IVPB - SIMPLE MED
500.0000 mg | Freq: Four times a day (QID) | INTRAVENOUS | Status: DC | PRN
  Filled 2018-12-07: qty 50

## 2018-12-07 MED ORDER — PHENYLEPHRINE HCL (PRESSORS) 10 MG/ML IV SOLN
INTRAVENOUS | Status: DC | PRN
  Administered 2018-12-07 (×2): 120 ug via INTRAVENOUS
  Administered 2018-12-07: 80 ug via INTRAVENOUS

## 2018-12-07 MED ORDER — MIDAZOLAM HCL 2 MG/2ML IJ SOLN
INTRAMUSCULAR | Status: AC
  Filled 2018-12-07: qty 2

## 2018-12-07 MED ORDER — ACETAMINOPHEN 325 MG PO TABS
325.0000 mg | ORAL_TABLET | Freq: Four times a day (QID) | ORAL | Status: DC | PRN
  Administered 2018-12-09 – 2018-12-10 (×2): 650 mg via ORAL
  Filled 2018-12-07 (×2): qty 2

## 2018-12-07 MED ORDER — DEXAMETHASONE SODIUM PHOSPHATE 10 MG/ML IJ SOLN
INTRAMUSCULAR | Status: AC
  Filled 2018-12-07: qty 1

## 2018-12-07 MED ORDER — SODIUM CHLORIDE 0.9 % IV SOLN
INTRAVENOUS | Status: DC
  Administered 2018-12-07: 75 mL/h via INTRAVENOUS

## 2018-12-07 MED ORDER — MENTHOL 3 MG MT LOZG: 1.0000 | LOZENGE | OROMUCOSAL | Status: DC | PRN

## 2018-12-07 MED ORDER — FENTANYL CITRATE (PF) 100 MCG/2ML IJ SOLN
INTRAMUSCULAR | Status: AC
  Filled 2018-12-07: qty 2

## 2018-12-07 MED ORDER — ONDANSETRON HCL 4 MG/2ML IJ SOLN: 4.0000 mg | Freq: Four times a day (QID) | INTRAMUSCULAR | Status: DC | PRN

## 2018-12-07 MED ORDER — CEFAZOLIN SODIUM-DEXTROSE 2-4 GM/100ML-% IV SOLN
2.0000 g | INTRAVENOUS | Status: AC
  Administered 2018-12-07: 2 g via INTRAVENOUS
  Filled 2018-12-07: qty 100

## 2018-12-07 MED ORDER — METOCLOPRAMIDE HCL 5 MG PO TABS: 5.0000 mg | ORAL_TABLET | Freq: Three times a day (TID) | ORAL | Status: DC | PRN

## 2018-12-07 MED ORDER — ONDANSETRON HCL 4 MG/2ML IJ SOLN
INTRAMUSCULAR | Status: AC
  Filled 2018-12-07: qty 2

## 2018-12-07 MED ORDER — FENTANYL CITRATE (PF) 100 MCG/2ML IJ SOLN
INTRAMUSCULAR | Status: DC | PRN
  Administered 2018-12-07: 100 ug via INTRAVENOUS

## 2018-12-07 MED ORDER — ACETAMINOPHEN 500 MG PO TABS: 1000.0000 mg | ORAL_TABLET | Freq: Once | ORAL | Status: DC

## 2018-12-07 MED ORDER — CEFAZOLIN SODIUM-DEXTROSE 2-4 GM/100ML-% IV SOLN: 2.0000 g | INTRAVENOUS | Status: DC

## 2018-12-07 MED ORDER — POLYETHYLENE GLYCOL 3350 17 G PO PACK
17.0000 g | PACK | Freq: Every day | ORAL | Status: DC | PRN
  Administered 2018-12-10: 17 g via ORAL
  Filled 2018-12-07: qty 1

## 2018-12-07 MED ORDER — MIDAZOLAM HCL 5 MG/5ML IJ SOLN
INTRAMUSCULAR | Status: DC | PRN
  Administered 2018-12-07: 2 mg via INTRAVENOUS

## 2018-12-07 MED ORDER — OXYCODONE HCL 5 MG PO TABS
10.0000 mg | ORAL_TABLET | ORAL | Status: DC | PRN
  Administered 2018-12-10: 10 mg via ORAL

## 2018-12-07 MED ORDER — PANTOPRAZOLE SODIUM 40 MG PO TBEC
40.0000 mg | DELAYED_RELEASE_TABLET | Freq: Every day | ORAL | Status: DC
  Administered 2018-12-07 – 2018-12-10 (×4): 40 mg via ORAL
  Filled 2018-12-07 (×4): qty 1

## 2018-12-07 SURGICAL SUPPLY — 41 items
ARTICULEZE HEAD (Hips) ×2 IMPLANT
BAG ZIPLOCK 12X15 (MISCELLANEOUS) IMPLANT
BALL HIP ARTICU EZE 36 8.5 (Hips) IMPLANT
BENZOIN TINCTURE PRP APPL 2/3 (GAUZE/BANDAGES/DRESSINGS) IMPLANT
BLADE SAW SGTL 18X1.27X75 (BLADE) ×2 IMPLANT
BLADE SURG SZ10 CARB STEEL (BLADE) ×4 IMPLANT
COVER PERINEAL POST (MISCELLANEOUS) ×2 IMPLANT
COVER SURGICAL LIGHT HANDLE (MISCELLANEOUS) ×2 IMPLANT
COVER WAND RF STERILE (DRAPES) IMPLANT
DRAPE STERI IOBAN 125X83 (DRAPES) ×2 IMPLANT
DRAPE U-SHAPE 47X51 STRL (DRAPES) ×4 IMPLANT
DRSG AQUACEL AG ADV 3.5X10 (GAUZE/BANDAGES/DRESSINGS) ×2 IMPLANT
DURAPREP 26ML APPLICATOR (WOUND CARE) ×2 IMPLANT
ELECT REM PT RETURN 15FT ADLT (MISCELLANEOUS) ×2 IMPLANT
GAUZE XEROFORM 1X8 LF (GAUZE/BANDAGES/DRESSINGS) ×3 IMPLANT
GLOVE BIO SURGEON STRL SZ7.5 (GLOVE) ×2 IMPLANT
GLOVE BIOGEL PI IND STRL 8 (GLOVE) ×2 IMPLANT
GLOVE BIOGEL PI INDICATOR 8 (GLOVE) ×2
GLOVE ECLIPSE 8.0 STRL XLNG CF (GLOVE) ×2 IMPLANT
GOWN STRL REUS W/TWL XL LVL3 (GOWN DISPOSABLE) ×4 IMPLANT
HANDPIECE INTERPULSE COAX TIP (DISPOSABLE) ×1
HEAD ARTICULEZE (Hips) IMPLANT
HIP BALL ARTICU EZE 36 8.5 (Hips) ×2 IMPLANT
HOLDER FOLEY CATH W/STRAP (MISCELLANEOUS) ×2 IMPLANT
KIT TURNOVER KIT A (KITS) IMPLANT
LINER ACETAB NEUTRAL 36ID 520D (Liner) ×1 IMPLANT
PACK ANTERIOR HIP CUSTOM (KITS) ×2 IMPLANT
PIN SECTOR W/GRIP ACE CUP 52MM (Hips) ×1 IMPLANT
SET HNDPC FAN SPRY TIP SCT (DISPOSABLE) ×1 IMPLANT
STAPLER VISISTAT 35W (STAPLE) IMPLANT
STEM CORAIL KA15 (Stem) ×1 IMPLANT
STRIP CLOSURE SKIN 1/2X4 (GAUZE/BANDAGES/DRESSINGS) IMPLANT
SUT ETHIBOND NAB CT1 #1 30IN (SUTURE) ×2 IMPLANT
SUT ETHILON 2 0 PS N (SUTURE) IMPLANT
SUT MNCRL AB 4-0 PS2 18 (SUTURE) IMPLANT
SUT VIC AB 0 CT1 36 (SUTURE) ×2 IMPLANT
SUT VIC AB 1 CT1 36 (SUTURE) ×2 IMPLANT
SUT VIC AB 2-0 CT1 27 (SUTURE) ×2
SUT VIC AB 2-0 CT1 TAPERPNT 27 (SUTURE) ×2 IMPLANT
TRAY FOLEY MTR SLVR 16FR STAT (SET/KITS/TRAYS/PACK) ×2 IMPLANT
YANKAUER SUCT BULB TIP 10FT TU (MISCELLANEOUS) ×2 IMPLANT

## 2018-12-07 NOTE — Care Plan (Signed)
        RNCM spoke with patient pre-op. He has a FWW and does not need any additional home equipment. Patient reports he has a family member that will assist upon discharge home. Anticipate home health Physical Therapy post-discharge. Referral to [facility] made- Choice provided. Please contact RNCM for any questions or changes to d/c plan. Jamse Arn, RN,BSN, [place] [phone].

## 2018-12-07 NOTE — Op Note (Signed)
NAME: [name], [name] MEDICAL RECORD OZ:[id number] ACCOUNT [id number] DATE OF BIRTH:05-13-1966 FACILITY: [facility] LOCATION: [facility] PHYSICIAN:Randall Comes Kerry Fort, MD  OPERATIVE REPORT  DATE OF PROCEDURE:  12/07/2018  PREOPERATIVE DIAGNOSIS:  Severe end-stage arthritis and degenerative joint disease, right hip.  POSTOPERATIVE DIAGNOSIS:  Severe end-stage arthritis and degenerative joint disease, right hip.  PROCEDURE:  Right total hip arthroplasty, direct anterior approach.  IMPLANTS:  DePuy Sector Gription acetabular component size 52, size 32+0 neutral polyethylene liner, size 15 Corail femoral component with standard offset, size 36+5 metal hip ball.  SURGEON:  [name]. Randall Linden, MD  ASSISTANT:  Randall Emery, PA-C  ANESTHESIA:  Spinal.  ANTIBIOTICS:  Two grams IV Ancef.  ESTIMATED BLOOD LOSS:  742 mL  COMPLICATIONS:  None.  INDICATIONS:  The patient is only a 53 year old gentleman but he has debilitating end-stage arthritis involving his right hip.  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  This certainly has made things difficult for him for ambulating and certainly will make things difficult for surgery as well.  His x-rays show severe end-stage arthritis with valgus femoral neck and significant loss of the joint space on the  left side.  At this point, he does wish to proceed with total hip arthroplasty and we have recommended this to him.  We talked about the risk of acute blood loss anemia, nerve or vessel injury, fracture, infection, dislocation, DVT and implant failure.   We talked about the goals being decreased pain, improved mobility and overall improve quality of life.  DESCRIPTION OF PROCEDURE:  After informed consent was obtained and appropriate right hip was marked, he was brought to the operating room, sat up on the stretcher where spinal anesthesia was then  obtained.  He was then laid in supine position on a  stretcher.  Foley catheter was placed.  We placed traction boots on both his feet and placed him supine on the Hana fracture table, the perineal post in place and both legs in line skeletal traction device and no traction applied.  Even with spinal  anesthesia, he had a flexion contracture at the hip and you could see this has been a chronic deformity and now at this point given that he has been dealing with pain for so many years.  His right operative hip was prepped and draped with DuraPrep and  sterile drapes.  A time-out was called and he was identified as correct patient, correct right hip.  We then made an incision just inferior and posterior to the anterior superior iliac spine and carried this obliquely down the leg.  We dissected down  tensor fascia lata muscle.  Tensor fascia was then divided longitudinally to proceed with direct anterior approach to the hip.  We identified and cauterized circumflex vessels and identified the hip capsule.  We entered the hip capsule in an L-type  format, finding moderate joint effusion and significant deformity around the femoral head and neck.  We had actually used an osteotome to remove the femoral head in pieces and we were able to get it out.  We then freshened up our femoral neck cut again  with an oscillating saw and completed this with an osteotome.  I then placed a bent Hohmann over the medial acetabular rim and removed remnants of bone and other debris  including the labrum from the acetabulum.  We then began reaming with stepwise  increments  from a size 44 reamer, going up to a size 51 with all reamers under direct visualization, the last reamer under direct fluoroscopy, so I obtained my depth of reaming my inclination and anteversion.  I then placed the real DePuy Sector Gription  acetabular component size 52 and a 36+0 liner for that size acetabular component.  Attention was then turned to the femur.   With the leg externally rotated to 120 degrees, extended and adducted, we were able to place a Mueller retractor medially and  Hohman retractor behind the greater trochanter, released lateral joint capsule and used a box-cutting osteotome to enter the femoral canal and a rongeur to lateralize.  We then began broaching from a size 8 broach using Corail broaching system going to a  size with a 15.  With a 15 in place, we trialed a standard offset femoral neck and a 36+1.5 hip ball.  We reduced this in the acetabulum and we felt like we definitely needed more leg length and offset.  We dislocated the hip and removed the trial  components.  We placed the real Corail femoral component size 15 with standard offset.  We went with a 36+8.5 hip ball.  We brought the leg back over and up with traction and internal rotation could not reduce in the pelvis.  Obviously showing his  flexion contracture and shortening of tissues with contracture we could not overcome.  We did remove that size 36+8.5 metal hip ball and went down to 36+5 hip ball.  We were able to reduce this in the acetabulum and it was stable on exam and under  fluoroscopy.  We then irrigated the soft tissue with normal saline solution using pulsatile lavage.  We were able to close the joint capsule with interrupted #1 Ethibond suture, followed by running #1 Vicryl to close the tensor fascia, 0 Vicryl was used  to close the deep tissue, 2-0 Vicryl was used to close the subcutaneous tissue and staples were used to close the skin.  Xeroform and Aquacel dressing was applied.  [facility]  We talked about placing a knee immobilizer on him just  because of the pain from his flexion contracture but we decided not to do so.  He was taken to recovery room in stable condition.  All final counts were correct.  There were no complications noted.  Of note, Randall EdisonGil Clark, PA-C, assisted the entire case.   His assistance was crucial for facilitating all  aspects of this case.  TN/NUANCE  D:12/07/2018 T:12/07/2018 JOB:[id number]

## 2018-12-07 NOTE — H&P (Signed)
TOTAL HIP ADMISSION H&P  Patient is admitted for right total hip arthroplasty.  Subjective:  Chief Complaint: right hip pain  HPI: [name], 53 y.o. male, has a history of pain and functional disability in the right hip(s) due to arthritis and patient has failed non-surgical conservative treatments for greater than 12 weeks to include NSAID's and/or analgesics, corticosteriod injections, viscosupplementation injections, flexibility and strengthening excercises, use of assistive devices, weight reduction as appropriate and activity modification.  Onset of symptoms was gradual starting 3 years ago with gradually worsening course since that time.The patient noted no past surgery on the right hip(s).  Patient currently rates pain in the right hip at 10 out of 10 with activity. Patient has night pain, worsening of pain with activity and weight bearing, trendelenberg gait, pain that interfers with activities of daily living and pain with passive range of motion. Patient has evidence of subchondral cysts, subchondral sclerosis, periarticular osteophytes and joint space narrowing by imaging studies. This condition presents safety issues increasing the risk of falls.  There is no current active infection.  Patient Active Problem List   Diagnosis Date Noted  . Unilateral primary osteoarthritis, right hip 07/16/2018   Past Medical History:  Diagnosis Date  . Asthma   . OA (osteoarthritis)    Right hip    Past Surgical History:  Procedure Laterality Date  . ANTERIOR CERVICAL DECOMP/DISCECTOMY FUSION  1996    No current facility-administered medications for this encounter.    Current Outpatient Medications  Medication Sig Dispense Refill Last Dose  . albuterol (PROVENTIL) (2.5 MG/3ML) 0.083% nebulizer solution Inhale 2.5 mg into the lungs 4 (four) times daily as needed for wheezing or shortness of breath.      . benzonatate (TESSALON) 100 MG capsule Take 1 capsule (100 mg total) by mouth  every 8 (eight) hours. (Patient not taking: Reported on 11/29/2018) 21 capsule 0 Not Taking at Unknown time   No Known Allergies  Social History   Tobacco Use  . Smoking status: Never Smoker  . Smokeless tobacco: Never Used  Substance Use Topics  . Alcohol use: Yes    Alcohol/week: 2.0 standard drinks    Types: 1 Glasses of wine, 1 Shots of liquor per week    Comment: Occas    Family History  Problem Relation Age of Onset  . Diabetes Mother      Review of Systems  Musculoskeletal: Positive for joint pain.  All other systems reviewed and are negative.   Objective:  Physical Exam  Constitutional: He is oriented to person, place, and time. He appears well-developed and well-nourished.  HENT:  Head: Normocephalic and atraumatic.  Eyes: Pupils are equal, round, and reactive to light. EOM are normal.  Neck: Normal range of motion. Neck supple.  Cardiovascular: Normal rate and regular rhythm.  Respiratory: Effort normal and breath sounds normal.  GI: Soft. Bowel sounds are normal.  Musculoskeletal:     Right hip: He exhibits decreased range of motion, decreased strength, tenderness and bony tenderness.  Neurological: He is alert and oriented to person, place, and time.  Skin: Skin is warm and dry.  Psychiatric: He has a normal mood and affect.    Vital signs in last 24 hours:    Labs:   Estimated body mass index is 20.85 kg/m as calculated from the following:   Height as of 12/05/18: 6\' 1"  (1.854 m).   Weight as of 12/05/18: 71.7 kg.   Imaging Review Plain radiographs demonstrate severe degenerative joint  disease of the right hip(s). The bone quality appears to be excellent for age and reported activity level.      Assessment/Plan:  End stage arthritis, right hip(s)  The patient history, physical examination, clinical judgement of the provider and imaging studies are consistent with end stage degenerative joint disease of the right hip(s) and total hip  arthroplasty is deemed medically necessary. The treatment options including medical management, injection therapy, arthroscopy and arthroplasty were discussed at length. The risks and benefits of total hip arthroplasty were presented and reviewed. The risks due to aseptic loosening, infection, stiffness, dislocation/subluxation,  thromboembolic complications and other imponderables were discussed.  The patient acknowledged the explanation, agreed to proceed with the plan and consent was signed. Patient is being admitted for inpatient treatment for surgery, pain control, PT, OT, prophylactic antibiotics, VTE prophylaxis, progressive ambulation and ADL's and discharge planning.The patient is planning to be discharged home with home health services    Patient's anticipated LOS is less than 2 midnights, meeting these requirements: - Younger than 2365 - Lives within 1 hour of care - Has a competent adult at home to recover with post-op recover - NO history of  - Chronic pain requiring opiods  - Diabetes  - Coronary Artery Disease  - Heart failure  - Heart attack  - Stroke  - DVT/VTE  - Cardiac arrhythmia  - Respiratory Failure/COPD  - Renal failure  - Anemia  - Advanced Liver disease

## 2018-12-07 NOTE — Anesthesia Procedure Notes (Signed)
Procedure Name: MAC Date/Time: 12/07/2018 2:04 PM Performed by: Lissa Morales, CRNA Pre-anesthesia Checklist: Patient identified, Emergency Drugs available, Suction available, Patient being monitored and Timeout performed Patient Re-evaluated:Patient Re-evaluated prior to induction Oxygen Delivery Method: Simple face mask Placement Confirmation: positive ETCO2

## 2018-12-07 NOTE — Anesthesia Procedure Notes (Addendum)
Spinal  Patient location during procedure: OR End time: 12/07/2018 1:12 PM Staffing Resident/CRNA: Lissa Morales, CRNA Performed: anesthesiologist  Preanesthetic Checklist Completed: patient identified, site marked, surgical consent, pre-op evaluation, timeout performed, IV checked, risks and benefits discussed and monitors and equipment checked Spinal Block Patient position: sitting Prep: Betadine and DuraPrep Patient monitoring: heart rate, continuous pulse ox and blood pressure Approach: midline Location: L3-4 Injection technique: single-shot Needle Needle type: Pencan  Needle gauge: 24 G Needle length: 9 cm Assessment Sensory level: T6 Additional Notes Expiration date of kit checked and confirmed. Patient tolerated procedure well, without complications.MonitorS AND O2 ON patient prior to procedure.

## 2018-12-07 NOTE — Anesthesia Postprocedure Evaluation (Signed)
Anesthesia Post Note  Patient: Corliss A Irmen  Procedure(s) Performed: RIGHT TOTAL HIP ARTHROPLASTY ANTERIOR APPROACH (Right )     Patient location during evaluation: PACU Anesthesia Type: Spinal Level of consciousness: oriented and awake and alert Pain management: pain level controlled Vital Signs Assessment: post-procedure vital signs reviewed and stable Respiratory status: spontaneous breathing, respiratory function stable and patient connected to nasal cannula oxygen Cardiovascular status: blood pressure returned to baseline and stable Postop Assessment: no headache, no backache, no apparent nausea or vomiting, spinal receding and patient able to bend at knees Anesthetic complications: no    Last Vitals:  Vitals:   12/07/18 1615 12/07/18 1630  BP: 110/65 111/63  Pulse: 80 85  Resp: 17 16  Temp: 37.1 C   SpO2: 97% 100%    Last Pain:  Vitals:   12/07/18 1630  TempSrc:   PainSc: 0-No pain                 Jeremiah Curci,W. EDMOND

## 2018-12-07 NOTE — Brief Op Note (Signed)
12/07/2018  2:53 PM  PATIENT:  [name]  53 y.o. male  PRE-OPERATIVE DIAGNOSIS:  severe osteoarthritis right hip  POST-OPERATIVE DIAGNOSIS:  severe osteoarthritis right hip  PROCEDURE:  Procedure(s): RIGHT TOTAL HIP ARTHROPLASTY ANTERIOR APPROACH (Right)  SURGEON:  Surgeon(s) and Role:    Mcarthur Rossetti, MD - Primary  PHYSICIAN ASSISTANT: Benita Stabile, PA-C  ANESTHESIA:   spinal  EBL:  400 mL   COUNTS:  YES  DICTATION: .Other Dictation: Dictation Number [id number]  PLAN OF CARE: Admit to inpatient   PATIENT DISPOSITION:  PACU - hemodynamically stable.   Delay start of Pharmacological VTE agent (>24hrs) due to surgical blood loss or risk of bleeding: no

## 2018-12-07 NOTE — Transfer of Care (Signed)
Immediate Anesthesia Transfer of Care Note  Patient: [name]  Procedure(s) Performed: RIGHT TOTAL HIP ARTHROPLASTY ANTERIOR APPROACH (Right )  Patient Location: PACU  Anesthesia Type:Spinal  Level of Consciousness: awake, alert , oriented and patient cooperative  Airway & Oxygen Therapy: Patient Spontanous Breathing and Patient connected to face mask oxygen  Post-op Assessment: Report given to RN and Post -op Vital signs reviewed and stable  Post vital signs: stable  Last Vitals:  Vitals Value Taken Time  BP 107/65 12/07/18 1515  Temp    Pulse 92 12/07/18 1527  Resp 14 12/07/18 1527  SpO2 100 % 12/07/18 1527  Vitals shown include unvalidated device data.  Last Pain:  Vitals:   12/07/18 1042  TempSrc:   PainSc: 0-No pain         Complications: No apparent anesthesia complications

## 2018-12-07 NOTE — Anesthesia Preprocedure Evaluation (Addendum)
Anesthesia Evaluation  Patient identified by MRN, date of birth, ID band Patient awake    Reviewed: Allergy & Precautions, H&P , NPO status , Patient's Chart, lab work & pertinent test results  Airway Mallampati: II  TM Distance: >3 FB Neck ROM: Full    Dental no notable dental hx. (+) Teeth Intact, Dental Advisory Given   Pulmonary asthma ,    Pulmonary exam normal breath sounds clear to auscultation       Cardiovascular negative cardio ROS   Rhythm:Regular Rate:Normal     Neuro/Psych negative neurological ROS  negative psych ROS   GI/Hepatic negative GI ROS, Neg liver ROS,   Endo/Other  negative endocrine ROS  Renal/GU negative Renal ROS  negative genitourinary   Musculoskeletal  (+) Arthritis , Osteoarthritis,    Abdominal   Peds  Hematology negative hematology ROS (+)   Anesthesia Other Findings   Reproductive/Obstetrics negative OB ROS                            Anesthesia Physical Anesthesia Plan  ASA: II  Anesthesia Plan: Spinal   Post-op Pain Management:    Induction: Intravenous  PONV Risk Score and Plan: 2 and Propofol infusion, Dexamethasone, Ondansetron and Midazolam  Airway Management Planned: Simple Face Mask  Additional Equipment:   Intra-op Plan:   Post-operative Plan:   Informed Consent: I have reviewed the patients History and Physical, chart, labs and discussed the procedure including the risks, benefits and alternatives for the proposed anesthesia with the patient or authorized representative who has indicated his/her understanding and acceptance.     Dental advisory given  Plan Discussed with: CRNA  Anesthesia Plan Comments:         Anesthesia Quick Evaluation

## 2018-12-07 NOTE — Plan of Care (Signed)
Care plan initiated.

## 2018-12-08 LAB — BASIC METABOLIC PANEL
Anion gap: 6 (ref 5–15)
BUN: 11 mg/dL (ref 6–20)
CO2: 27 mmol/L (ref 22–32)
Calcium: 8.4 mg/dL — ABNORMAL LOW (ref 8.9–10.3)
Chloride: 100 mmol/L (ref 98–111)
Creatinine, Ser: 0.77 mg/dL (ref 0.61–1.24)
GFR calc Af Amer: >60 mL/min (ref >60)
GFR calc non Af Amer: >60 mL/min (ref >60)
Glucose, Bld: 215 mg/dL — ABNORMAL HIGH (ref 70–99)
Potassium: 4.3 mmol/L (ref 3.5–5.1)
Sodium: 133 mmol/L — ABNORMAL LOW (ref 135–145)

## 2018-12-08 LAB — CBC
HCT: 31.9 % — ABNORMAL LOW (ref 39.0–52.0)
Hemoglobin: 10.1 g/dL — ABNORMAL LOW (ref 13.0–17.0)
MCH: 28.9 pg (ref 26.0–34.0)
MCHC: 31.7 g/dL (ref 30.0–36.0)
MCV: 91.4 fL (ref 80.0–100.0)
Platelets: 301 10*3/uL (ref 150–400)
RBC: 3.49 MIL/uL — ABNORMAL LOW (ref 4.22–5.81)
RDW: 13.2 % (ref 11.5–15.5)
WBC: 12.8 10*3/uL — ABNORMAL HIGH (ref 4.0–10.5)
nRBC: 0 % (ref 0.0–0.2)

## 2018-12-08 MED ORDER — OXYCODONE HCL 5 MG PO TABS: 5.0000 mg | ORAL_TABLET | Freq: Four times a day (QID) | ORAL | 0 refills | Status: DC | PRN

## 2018-12-08 MED ORDER — ASPIRIN 81 MG PO CHEW: 81.0000 mg | CHEWABLE_TABLET | Freq: Two times a day (BID) | ORAL | 0 refills | Status: AC

## 2018-12-08 MED ORDER — METHOCARBAMOL 500 MG PO TABS: 500.0000 mg | ORAL_TABLET | Freq: Four times a day (QID) | ORAL | 1 refills | Status: DC | PRN

## 2018-12-08 NOTE — Progress Notes (Signed)
Subjective: 1 Day Post-Op Procedure(s) (LRB): RIGHT TOTAL HIP ARTHROPLASTY ANTERIOR APPROACH (Right) Patient reports pain as moderate.    Objective: Vital signs in last 24 hours: Temp:  [97.5 F (36.4 C)-98.7 F (37.1 C)] 98.2 F (36.8 C) (07/25 0951) Pulse Rate:  [80-96] 95 (07/25 0951) Resp:  [12-20] 16 (07/25 0951) BP: (99-129)/(59-92) 123/72 (07/25 0951) SpO2:  [95 %-100 %] 100 % (07/25 0951) Weight:  [71.7 kg] 71.7 kg (07/24 1705)  Intake/Output from previous day: 07/24 0701 - 07/25 0700 In: 3562.6 [P.O.:480; I.V.:3032.6; IV Piggyback:50] Out: 3025 [Urine:2625; Blood:400] Intake/Output this shift: Total I/O In: 540 [P.O.:240; I.V.:300] Out: -   Recent Labs    12/05/18 1351 12/08/18 0333  HGB 12.5* 10.1*   Recent Labs    12/05/18 1351 12/08/18 0333  WBC 13.4* 12.8*  RBC 4.47 3.49*  HCT 41.1 31.9*  PLT 386 301   Recent Labs    12/08/18 0333  NA 133*  K 4.3  CL 100  CO2 27  BUN 11  CREATININE 0.77  GLUCOSE 215*  CALCIUM 8.4*   No results for input(s): LABPT, INR in the last 72 hours.  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    Mcarthur Rossetti 12/08/2018, 12:26 PM

## 2018-12-08 NOTE — Progress Notes (Signed)
12/08/18 1600  PT Visit Information  Last PT Received On 12/08/18  Pt amb 30' with min/mod assist; requiring assist with wt shift/balance as well as assist to incr R knee and hip extension; pt with intermittent RLE  tremors during gait  Which he states happened prior to surgery as well. Continue PT POC.  History of Present Illness 53 yo male s/p R DA THA. 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 Magnus IvanBlackman to improve knee extension and prevent pt from going into excessive  hip/knee flexion  Required Braces or Orthoses Knee Immobilizer - Right  Knee Immobilizer - Right Other (comment) (to encourage hip/knee extension)  Restrictions  Other Position/Activity Restrictions WBAT  Pain Assessment  Pain Assessment 0-10  Pain Score 4  Pain Location right hip and knee  Pain Descriptors / Indicators Grimacing;Operative site guarding;Spasm  Pain Intervention(s) Limited activity within patient's tolerance;Monitored during session;Premedicated before session;Repositioned  Cognition  Arousal/Alertness Awake/alert  Behavior During Therapy WFL for tasks assessed/performed  Overall Cognitive Status Within Functional Limits for tasks assessed  Bed Mobility  Overal bed mobility Needs Assistance  Bed Mobility Supine to Sit  Supine to sit Min assist  General bed mobility comments assist with RLE   Transfers  Overall transfer level Needs assistance  Transfers Sit to/from Stand  Sit to Stand Min assist;From elevated surface  General transfer comment cues for hand placement, assist to rise and transition to RW  Ambulation/Gait  Ambulation/Gait assistance Min assist;Mod assist  Gait Distance (Feet) 30 Feet  Assistive device Rolling walker (2 wheeled)  Gait Pattern/deviations Step-to pattern;Decreased weight shift to right;Trunk flexed;Narrow  base of support  General Gait Details cues for sequence, step length, trunk/hip extension, and to widen BOS   Total Joint Exercises  Ankle Circles/Pumps AROM;10 reps;Both  Quad Sets AROM;Right;10 reps  Heel Slides AAROM;Right;5 reps  PT - End of Session  Equipment Utilized During Treatment Gait belt;Right knee immobilizer  Activity Tolerance Patient tolerated treatment well  Patient left with call bell/phone within reach;in chair;with chair alarm set   PT - Assessment/Plan  PT Plan Current plan remains appropriate  PT Visit Diagnosis Difficulty in walking, not elsewhere classified (R26.2)  PT Frequency (ACUTE ONLY) 7X/week  Follow Up Recommendations Follow surgeon's recommendation for DC plan and follow-up therapies  PT equipment 3in1 (PT);Rolling walker with 5" wheels  AM-PAC PT "6 Clicks" Mobility Outcome Measure (Version 2)  Help needed turning from your back to your side while in a flat bed without using bedrails? 3  Help needed moving from lying on your back to sitting on the side of a flat bed without using bedrails? 3  Help needed moving to and from a bed to a chair (including a wheelchair)? 3  Help needed standing up from a chair using your arms (e.g., wheelchair or bedside chair)? 3  Help needed to walk in hospital room? 2  Help needed climbing 3-5 steps with a railing?  2  6 Click Score 16  [facility]  PT Goal Progression  Progress towards PT goals Progressing toward goals  Acute Rehab PT Goals  PT Goal Formulation With patient  Time For Goal Achievement 12/15/18  Potential to Achieve Goals Good  PT Time Calculation  PT Start Time (ACUTE ONLY) 1436  PT Stop Time (ACUTE ONLY) 1515  PT Time Calculation (  min) (ACUTE ONLY) 39 min  PT General Charges  $$ ACUTE PT VISIT 1 Visit  PT Treatments  $Gait Training 23-37 mins  $Neuromuscular Re-education 8-22 mins

## 2018-12-08 NOTE — Plan of Care (Signed)

## 2018-12-08 NOTE — Evaluation (Signed)
Physical Therapy Evaluation Patient Details Name: [name] MRN: [id number] DOB: 1965-06-09 Today's Date: 12/08/2018   History of Present Illness  53 yo male s/p R DA THA  Clinical Impression  Pt admitted with above diagnosis. Pt currently with functional limitations due to the deficits listed below (see PT Problem List).  Limited eval as pt eating lunch and seen by MD during PT assessment also. eval focused on pt education, RLE stretching and ROM--knee and hip extension. Will progress mobility next session  Pt will benefit from skilled PT to increase their independence and safety with mobility to allow discharge to the venue listed below.       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 Ninfa Linden to improve knee extension and prevent pt from going into excessive  hip/knee flexion Required Braces or Orthoses: Knee Immobilizer - Right Knee Immobilizer - Right: Other (comment) Restrictions Weight Bearing Restrictions: No      Mobility  Bed Mobility               General bed mobility comments: deferred at this time d/t pt eating lunch  Transfers                    Ambulation/Gait                Stairs            Wheelchair Mobility    Modified Rankin (Stroke Patients Only)       Balance                                             Pertinent Vitals/Pain Pain Assessment: 0-10 Pain Score: 5  Pain Location: right hip and knee Pain Descriptors / Indicators: Grimacing;Operative site guarding;Spasm Pain Intervention(s): Limited activity within patient's tolerance;Monitored during session;Premedicated  before session;Repositioned    Home Living Family/patient expects to be discharged to:: Private residence Living Arrangements: Other relatives                    Prior Function   TBD              Hand Dominance        Extremity/Trunk Assessment   Upper Extremity Assessment Upper Extremity Assessment: Overall WFL for tasks assessed    Lower Extremity Assessment Lower Extremity Assessment: RLE deficits/detail RLE Deficits / Details: pt with hip in external rotation-->grossly 90 degrees hip and knee flexion--able to get pt to ~ 45 degrees hip flexion and ~30* and near neutral hip after manual stretching. ankle grossly [facility]       Communication      Cognition Arousal/Alertness: Awake/alert Behavior During Therapy: WFL for tasks assessed/performed Overall Cognitive Status: Within Functional Limits for tasks assessed                                        General Comments      Exercises Total Joint Exercises Ankle Circles/Pumps: AROM;10 reps;Both Quad Sets: AROM;5 reps;Both Heel  Slides: AAROM;Right;5 reps   Assessment/Plan    PT Assessment Patient needs continued PT services  PT Problem List Decreased strength;Decreased range of motion;Decreased activity tolerance;Decreased mobility;Pain;Decreased balance;Decreased knowledge of use of DME       PT Treatment Interventions DME instruction;Gait training;Functional mobility training;Therapeutic activities;Therapeutic exercise;Stair training;Patient/family education    PT Goals (Current goals can be found in the Care Plan section)  Acute Rehab PT Goals PT Goal Formulation: With patient Time For Goal Achievement: 12/15/18 Potential to Achieve Goals: Good    Frequency 7X/week   Barriers to discharge        Co-evaluation               AM-PAC PT "6 Clicks" Mobility  Outcome Measure Help needed turning from your back to your side while in a flat bed without using bedrails?: A  Little Help needed moving from lying on your back to sitting on the side of a flat bed without using bedrails?: A Little   Help needed standing up from a chair using your arms (e.g., wheelchair or bedside chair)?: A Little Help needed to walk in hospital room?: A Lot Help needed climbing 3-5 steps with a railing? : A Lot 6 Click Score: 13    End of Session   Activity Tolerance: Patient tolerated treatment well Patient left: in bed;with call bell/phone within reach   PT Visit Diagnosis: Difficulty in walking, not elsewhere classified (R26.2)    Time: 1610-96041158-1209 PT Time Calculation (min) (ACUTE ONLY): 11 min   Charges:   PT Evaluation $PT Eval Low Complexity: 1 Low          Drucilla Chaletara Gabrial Poppell, PT  Pager: [phone] Acute Rehab Dept [facility]): [phone]   12/08/2018   [facility],[name] 12/08/2018, 12:49 PM

## 2018-12-08 NOTE — Discharge Instructions (Signed)

## 2018-12-09 NOTE — Plan of Care (Signed)
?  Problem: Clinical Measurements: ?Goal: Postoperative complications will be avoided or minimized ?Outcome: Progressing ?  ?Problem: Pain Management: ?Goal: Pain level will decrease with appropriate interventions ?Outcome: Progressing ?  ?Problem: Activity: ?Goal: Ability to avoid complications of mobility impairment will improve ?Outcome: Progressing ?  ?

## 2018-12-09 NOTE — Progress Notes (Signed)
Pt not ready for discharge according to physical therapy evaluation this afternoon. Dr. Lorin Mercy notified of this finding and awaiting a return message back.

## 2018-12-09 NOTE — Progress Notes (Signed)
   Subjective: 2 Days Post-Op Procedure(s) (LRB): RIGHT TOTAL HIP ARTHROPLASTY ANTERIOR APPROACH (Right) Patient reports pain as mild and moderate.    Objective: Vital signs in last 24 hours: Temp:  [97.6 F (36.4 C)-98.6 F (37 C)] 98.6 F (37 C) (07/26 5284) Pulse Rate:  [87-95] 94 (07/26 0637) Resp:  [14-16] 14 (07/26 0637) BP: (118-133)/(69-78) 118/69 (07/26 0637) SpO2:  [97 %-100 %] 97 % (07/26 0637)  Intake/Output from previous day: 07/25 0701 - 07/26 0700 In: 1258.1 [P.O.:580; I.V.:678.1] Out: 1050 [Urine:1050] Intake/Output this shift: No intake/output data recorded.  Recent Labs    12/08/18 0333  HGB 10.1*   Recent Labs    12/08/18 0333  WBC 12.8*  RBC 3.49*  HCT 31.9*  PLT 301   Recent Labs    12/08/18 0333  NA 133*  K 4.3  CL 100  CO2 27  BUN 11  CREATININE 0.77  GLUCOSE 215*  CALCIUM 8.4*   No results for input(s): LABPT, INR in the last 72 hours.  Neurologically intact No results found.  Assessment/Plan: 2 Days Post-Op Procedure(s) (LRB): RIGHT TOTAL HIP ARTHROPLASTY ANTERIOR APPROACH (Right) Up with therapy today then home. Rx sent to pharmacy by Dr. Ninfa Linden.   [name] 12/09/2018, 8:32 AM

## 2018-12-09 NOTE — TOC Progression Note (Addendum)
Transition of Care [facility]) - Progression Note    Patient Details  Name: [name] MRN: [id number] Date of Birth: 06/19/1965  [facility] CM/SW Contact  Joaquin Courts, RN Phone Number: 12/09/2018, 2:08 PM  Clinical Narrative:    [facility] with [facility] for HH[facility]. [employer] to deliver rolling walker and 3-in-1 to bedside for home use.    Expected Discharge Plan: [place] Barriers to Discharge: No Barriers Identified  Expected Discharge Plan and Services Expected Discharge Plan: [place]   Discharge Planning Services: CM Consult   Living arrangements for the past 2 months: Single Family Home Expected Discharge Date: 12/10/18               DME Arranged: Gilford Rile rolling, 3-N-1 DME Agency: [employer] Date DME Agency Contacted: 12/09/18 Time DME Agency Contacted: [phone] Representative spoke with at DME Agency: Bertrum Sol     Representative spoke with at [facility]: Pre arranged in MD office   Social Determinants of Health (SDOH) Interventions    Readmission Risk Interventions No flowsheet data found.

## 2018-12-09 NOTE — Progress Notes (Signed)
Pt placed back to bed from chair by rn using gait belt and rolling walker. Pt was very stiff and difficult to move with this transfer. He remains very weak with limited range of motion at times in affected hip. rn medicated pt for pain. No needs at this time. Rn encouraged pt to be less stiff in right hip to help with range of motion.

## 2018-12-09 NOTE — Progress Notes (Signed)
Physical Therapy Treatment Patient Details Name: [name] MRN: [id number] DOB: 01/05/1966 Today's Date: 12/09/2018    History of Present Illness 53 yo male s/p R DA THA. 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 Pt requiring more frequent rest breaks today d/t pain and fatigue.  Pt continues to demonstrate incr flexor tone RLE (present prior to surgery). Educated on positioning RLE/stretching.  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 Ninfa Linden to improve knee extension and prevent pt from going into excessive  hip/knee flexion Required Braces or Orthoses: Knee Immobilizer - Right Knee Immobilizer - Right: Other (comment)(to encourage/maintain knee extension ) Restrictions Weight Bearing Restrictions: No Other Position/Activity Restrictions: WBAT    Mobility  Bed Mobility   Bed Mobility: Supine to Sit     Supine to sit: Min assist     General bed mobility comments: assist with RLE   Transfers Overall transfer level: Needs assistance Equipment used: Rolling walker (2 wheeled) Transfers: Sit to/from Stand Sit to Stand: Mod assist         General transfer comment: cues for hand placement, assist to rise and transition to RW-- from bed and chair  Ambulation/Gait Ambulation/Gait assistance: Min assist;Mod assist Gait Distance (Feet): 15 Feet(x2) Assistive device: Rolling walker (2 wheeled)       General Gait Details: cues for sequence, step length, trunk/hip extension, and to widen BOS    Stairs Stairs: Yes Stairs assistance:  Mod assist;Min assist Stair Management: Two rails;Step to pattern Number of Stairs: 2 General stair comments: cues for sequence and technique, instructed pt to use 1 rail and walker (as he does not have 2 rails at home) however pt requiring heavy use of UEs and support of bil rails to boost upstairs     Wheelchair Mobility    Modified Rankin (Stroke Patients Only)       Balance Overall balance assessment: Needs assistance         Standing balance support: Bilateral upper extremity supported Standing balance-Leahy Scale: Poor Standing balance comment: reliant on UEs                             Cognition Arousal/Alertness: Awake/alert Behavior During Therapy: WFL for tasks assessed/performed Overall Cognitive Status: Within Functional Limits for tasks assessed                                        Exercises Total Joint Exercises Heel Slides: AAROM;Right;5 reps Other Exercises Other Exercises: prolonged knee extension stretch, encouraged hip extension stretch    General Comments        Pertinent Vitals/Pain Pain Assessment: 0-10 Pain Score: 5  Pain Location: right hip and knee Pain Descriptors / Indicators: Grimacing;Operative site guarding;Spasm Pain Intervention(s): Limited activity within patient's tolerance;Monitored during session;Premedicated before session;Repositioned;Ice applied    Home Living                      Prior Function  PT Goals (current goals can now be found in the care plan section) Acute Rehab PT Goals PT Goal Formulation: With patient Time For Goal Achievement: 12/15/18 Potential to Achieve Goals: Good Progress towards PT goals: Progressing toward goals    Frequency    7X/week      PT Plan Current plan remains appropriate    Co-evaluation              AM-PAC PT "6 Clicks" Mobility   Outcome Measure  Help needed turning from your back to your side while in a flat bed  without using bedrails?: A Little Help needed moving from lying on your back to sitting on the side of a flat bed without using bedrails?: A Little Help needed moving to and from a bed to a chair (including a wheelchair)?: A Little Help needed standing up from a chair using your arms (e.g., wheelchair or bedside chair)?: A Little Help needed to walk in hospital room?: A Lot Help needed climbing 3-5 steps with a railing? : A Lot 6 Click Score: 16    End of Session Equipment Utilized During Treatment: Gait belt;Right knee immobilizer Activity Tolerance: Patient tolerated treatment well Patient left: with call bell/phone within reach;in chair;with chair alarm set Nurse Communication: Mobility status PT Visit Diagnosis: Difficulty in walking, not elsewhere classified (R26.2)     Time: 1610-96041254-1333 PT Time Calculation (min) (ACUTE ONLY): 39 min  Charges:  $Gait Training: 23-37 mins $Neuromuscular Re-education: 8-22 mins                     [name], PT  Pager: [phone] Acute Rehab Dept [facility]): [phone]   12/09/2018    [facility] Center[facility]ILL[facility]AMS,[name] 12/09/2018, 2:05 PM

## 2018-12-09 NOTE — Progress Notes (Signed)
Physical Therapy Treatment Patient Details Name: [name] MRN: [id number] DOB: 03-Sep-1965 Today's Date: 12/09/2018    History of Present Illness 53 yo male s/p R DA THA. 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  Pt is motivated and compliant with PT.  Worked on stretching right  hip/knee in supine and progressed to standing/WBing, returned to supine and placed KI. Able to achieve hip extension ~ 10 degrees, knee ~ 25-30 degrees flexion. 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 Ninfa Linden to improve knee extension and prevent pt from going into excessive  hip/knee flexion Required Braces or Orthoses: Knee Immobilizer - Right Knee Immobilizer - Right: Other (comment)(to encourage/maintain knee extension) Restrictions Other Position/Activity Restrictions: WBAT    Mobility  Bed Mobility   Bed Mobility: Supine to Sit;Sit to Supine     Supine to sit: Min assist Sit to supine: Min assist   General bed mobility comments: assist with LEs  Transfers Overall transfer level: Needs assistance Equipment used: Rolling walker (2 wheeled) Transfers: Sit to/from Stand Sit to Stand: Min assist;From elevated surface         General transfer comment: assist to safety rise, bed ht elevated; cues and manual facilitation of right  knee/hip extension, WBing through RLE  to decr flexor tone  Ambulation/Gait                  Stairs             Wheelchair Mobility    Modified Rankin (Stroke Patients Only)       Balance             Standing balance-Leahy Scale: Poor Standing balance comment: reliant on UEs                             Cognition Arousal/Alertness: Awake/alert Behavior During Therapy: WFL for tasks assessed/performed Overall Cognitive Status: Within Functional Limits for tasks assessed                                        Exercises Total Joint Exercises Heel Slides: AAROM;Right; 10 reps Other Exercises Other Exercises: prolonged knee extension stretch, encouraged hip extension stretch Other Exercises: ankle rotation, achilles tendon stretch    General Comments        Pertinent Vitals/Pain Pain Assessment: 0-10 Pain Score: 3  Pain Location: right hip and knee Pain Descriptors / Indicators: Grimacing;Operative site guarding;Spasm Pain Intervention(s): Monitored during session;Premedicated before session    Home Living                      Prior Function            PT Goals (current goals can now be found in the care plan section) Acute Rehab PT Goals PT Goal Formulation: With  patient Time For Goal Achievement: 12/15/18 Potential to Achieve Goals: Good Progress towards PT goals: Progressing toward goals    Frequency    7X/week      PT Plan Current plan remains appropriate    Co-evaluation              AM-PAC PT "6 Clicks" Mobility   Outcome Measure  Help needed turning from your back to your side while in a flat bed without using bedrails?: A Little Help needed moving from lying on your back to sitting on the side of a flat bed without using bedrails?: A Little Help needed moving to and from a bed to a chair (including a wheelchair)?: A Little Help needed standing up from a chair using your arms (e.g., wheelchair or bedside chair)?: A Little Help needed to walk in hospital room?: A Lot Help  needed climbing 3-5 steps with a railing? : A Lot 6 Click Score: 16    End of Session Equipment Utilized During Treatment: Gait belt;Right knee immobilizer Activity Tolerance: Patient tolerated treatment well Patient left: with call bell/phone within reach;in bed;with bed alarm set Nurse Communication: Mobility status PT Visit Diagnosis: Difficulty in walking, not elsewhere classified (R26.2)     Time: [id number] PT Time Calculation (min) (ACUTE ONLY): 38 min  Charges:  $Therapeutic Activity: 8-22 mins $Neuromuscular Re-education: 23-37 mins                     Drucilla Chaletara Raul Winterhalter, PT  Pager: [phone] Acute Rehab Dept [facility]): [phone]   12/09/2018    [facility],Randall Degraffenreid 12/09/2018, 5:04 PM

## 2018-12-10 ENCOUNTER — Encounter (HOSPITAL_COMMUNITY): Payer: Self-pay | Admitting: Orthopaedic Surgery

## 2018-12-10 NOTE — Progress Notes (Signed)
Patient ID: [name], male   DOB: 10-27-1965, 53 y.o.   MRN: [id number] Making slow progress with therapy.  Very motivated.  Right hip stable.  Can be discharged to home this afternoon.

## 2018-12-10 NOTE — Discharge Summary (Signed)
Patient ID: [name] MRN: [id number] DOB/AGE: Aug 24, 1965 53 y.o.  Admit date: 12/07/2018 Discharge date: 12/10/2018  Admission Diagnoses:  Principal Problem:   Unilateral primary osteoarthritis, right hip Active Problems:   Status post total replacement of right hip   Discharge Diagnoses:  Same  Past Medical History:  Diagnosis Date  . Asthma   . OA (osteoarthritis)    Right hip    Surgeries: Procedure(s): RIGHT TOTAL HIP ARTHROPLASTY ANTERIOR APPROACH on 12/07/2018   Consultants:   Discharged Condition: Improved  Hospital Course: [name] is an 53 y.o. male who was admitted 12/07/2018 for operative treatment ofUnilateral primary osteoarthritis, right hip. Patient has severe unremitting pain that affects sleep, daily activities, and work/hobbies. After pre-op clearance the patient was taken to the operating room on 12/07/2018 and underwent  Procedure(s): RIGHT TOTAL HIP ARTHROPLASTY ANTERIOR APPROACH.    Patient was given perioperative antibiotics:  Anti-infectives (From admission, onward)   Start     Dose/Rate Route Frequency Ordered Stop   12/07/18 1915  ceFAZolin (ANCEF) IVPB 1 g/50 mL premix     1 g 100 mL/hr over 30 Minutes Intravenous Every 6 hours 12/07/18 1708 12/08/18 0142   12/07/18 1030  ceFAZolin (ANCEF) IVPB 2g/100 mL premix  Status:  Discontinued     2 g 200 mL/hr over 30 Minutes Intravenous On call to O.R. 12/07/18 1019 12/07/18 1020   12/07/18 1030  ceFAZolin (ANCEF) IVPB 2g/100 mL premix     2 g 200 mL/hr over 30 Minutes Intravenous On call to O.R. 12/07/18 1019 12/07/18 1315       Patient was given sequential compression devices, early ambulation, and chemoprophylaxis to prevent DVT.  Patient benefited maximally from hospital stay and there were no complications.    Recent vital signs:  Patient Vitals for the past 24 hrs:  BP Temp Temp src Pulse Resp SpO2  12/10/18 0534 118/72 99.9 F (37.7 C) Oral (!) 102 - 96 %  12/09/18  2058 122/72 98.5 F (36.9 C) Oral 100 18 100 %  12/09/18 1348 117/72 99 F (37.2 C) - (!) 109 16 98 %     Recent laboratory studies:  Recent Labs    12/08/18 0333  WBC 12.8*  HGB 10.1*  HCT 31.9*  PLT 301  NA 133*  K 4.3  CL 100  CO2 27  BUN 11  CREATININE 0.77  GLUCOSE 215*  CALCIUM 8.4*     Discharge Medications:   Allergies as of 12/10/2018   No Known Allergies     Medication List    TAKE these medications   albuterol (2.5 MG/3ML) 0.083% nebulizer solution Commonly known as: PROVENTIL Inhale 2.5 mg into the lungs 4 (four) times daily as needed for wheezing or shortness of breath.   aspirin 81 MG chewable tablet Chew 1 tablet (81 mg total) by mouth 2 (two) times daily.   methocarbamol 500 MG tablet Commonly known as: ROBAXIN Take 1 tablet (500 mg total) by mouth every 6 (six) hours as needed for muscle spasms.   oxyCODONE 5 MG immediate release tablet Commonly known as: Oxy IR/ROXICODONE Take 1-2 tablets (5-10 mg total) by mouth every 6 (six) hours as needed for moderate pain (pain score 4-6).            Durable Medical Equipment  (From admission, onward)         Start     Ordered   12/07/18 1709  DME Walker rolling  Once    Question:  Patient needs a walker to treat with the following condition  Answer:  Status post total replacement of right hip   12/07/18 1708   12/07/18 1709  DME 3 n 1  Once     12/07/18 1708          Diagnostic Studies: Dg Pelvis Portable  Result Date: 12/07/2018 CLINICAL DATA:  Postop, right hip prosthesis. EXAM: PORTABLE PELVIS 1-2 VIEWS COMPARISON:  Operative images obtained earlier today. FINDINGS: Single AP view shows a total right hip arthroplasty. The components appear well seated and well aligned. No acute fracture or evidence of an operative complication. IMPRESSION: Well-positioned total right hip arthroplasty Electronically Signed   By: Amie Portlandavid  Ormond M.D.   On: 12/07/2018 15:48   Dg C-arm 1-60 Min-no  Report  Result Date: 12/07/2018 Fluoroscopy was utilized by the requesting physician.  No radiographic interpretation.   Dg Hip Operative Unilat W Or W/o Pelvis Right  Result Date: 12/07/2018 CLINICAL DATA:  Status post right hip replacement EXAM: OPERATIVE RIGHT HIP WITH PELVIS COMPARISON:  None. FLUOROSCOPY TIME:  Radiation Exposure Index (as provided by the fluoroscopic device): 2.49 mGy If the device does not provide the exposure index: Fluoroscopy Time:  19 seconds Number of Acquired Images:  7 FINDINGS: Initial images again demonstrate severe degenerative change of the right hip joint. Subsequent placement of right femoral prosthesis is seen in satisfactory position. No acute abnormality is noted. IMPRESSION: Status post right hip replacement. Electronically Signed   By: Alcide CleverMark  Lukens M.D.   On: 12/07/2018 15:09    Disposition: Discharge disposition: 01-Home or Self Care         Follow-up Information    Kathryne HitchBlackman, Darrien Laakso Y, MD. Go on 12/24/2018.   Specialty: Orthopedic Surgery Why: Follow up with MD at 1:30 pm.  Contact information: [address] [place] [address] [phone]        [facility]. Go on 12/10/2018.   Specialty: Home Health Services Why: Someone from [facility] will be in contact upon discharge home. Home Health Physical Therapy will begin after discharge for 5 visits prior to your first MD appointment.       [facility] .   Contact information: [address] [place] [id number] [phone]           Signed: Kathryne HitchChristopher Y Caeden Foots 12/10/2018, 7:29 AM

## 2018-12-10 NOTE — Progress Notes (Signed)
Physical Therapy Treatment Patient Details Name: Randall PasturesJonathon A Munoz MRN: [id number] DOB: 12/15/1965 Today's Date: 12/10/2018    History of Present Illness 53 yo male s/p R DA THA. 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  General stair comments: "Let me show you how I do it".  Pt struggled but was able to navigate 2 steps going up with RIGHT first and excessive lean on R rails.  very unsteady but completed.General transfer comment: excessive UE use to lift body weight with almost minimal use B LE's    Great difficulty self rising and lower with limited B hip and knee flex.  B LE very ADucted.  General Gait Details: amb with out KI this session for trial.  Poor steppage R LE with hip and flex tone and foot remains PF.  Excessive WBing thru walker B UE's.  Used Therapist foot to advance R LE correctly and completely.  VERY unsteady.  Limited distance due to effort.  Assisted to bathroom then assisted with a wash up/peri care and dressing.  Assisted to recliner.  Addressed all mobility questions, discussed appropriate activity, educated on use of ICE.  Pt ready for D/C to home.   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 Magnus IvanBlackman to improve knee extension and prevent pt from going into excessive  hip/knee flexion Required Braces or Orthoses: Knee Immobilizer - Right Restrictions Weight Bearing Restrictions: No Other Position/Activity Restrictions: WBAT    Mobility  Bed Mobility               General bed mobility  comments: OOB in recliner  Transfers Overall transfer level: Needs assistance Equipment used: Rolling walker (2 wheeled) Transfers: Sit to/from Stand Sit to Stand: Min assist;From elevated surface         General transfer comment: excessive UE use to lift body weight with almost minimal use B LE's    Great difficulty self rising and lower with limited B hip and knee flex.  B LE very ADucted.  Ambulation/Gait Ambulation/Gait assistance: Mod assist Gait Distance (Feet): 14 Feet Assistive device: Rolling walker (2 wheeled) Gait Pattern/deviations: Step-to pattern;Decreased weight shift to right;Trunk flexed;Narrow base of support Gait velocity: decreased   General Gait Details: amb with out KI this session for trial.  Poor steppage R LE with hip and flex tone and foot remains PF.  Excessive WBing thru walker B UE's.  Used Therapist foot to advance R LE correctly and completely.  VERY unsteady.  Limited distance due to effort.   Stairs Stairs: Yes Stairs assistance: Mod assist;Min assist Stair Management: One rail Right;With walker Number of Stairs: 2 General stair comments: "Let me show you how I do it".  Pt struggled but was able to navigate 2 steps going up with RIGHT first and excessive lean on R rails.  very unsteady but completed.   Wheelchair Mobility    Modified Rankin (Stroke Patients Only)       Balance Overall balance assessment: Needs assistance         Standing balance support: Bilateral upper  extremity supported Standing balance-Leahy Scale: Poor Standing balance comment: reliant on UEs                             Cognition Arousal/Alertness: Awake/alert Behavior During Therapy: WFL for tasks assessed/performed Overall Cognitive Status: Within Functional Limits for tasks assessed                                        Exercises      General Comments        Pertinent Vitals/Pain Pain Assessment: 0-10 Pain Score: 5   Pain Location: right hip Pain Descriptors / Indicators: Grimacing;Operative site guarding;Sore Pain Intervention(s): Monitored during session;Repositioned    Home Living Family/patient expects to be discharged to:: Private residence Living Arrangements: Other relatives             Additional Comments: 3 n 1 delivered    Prior Function Level of Independence: Independent          PT Goals (current goals can now be found in the care plan section) Acute Rehab PT Goals Patient Stated Goal: home today Progress towards PT goals: Progressing toward goals    Frequency    7X/week      PT Plan Current plan remains appropriate    Co-evaluation              AM-PAC PT "6 Clicks" Mobility   Outcome Measure  Help needed turning from your back to your side while in a flat bed without using bedrails?: A Little Help needed moving from lying on your back to sitting on the side of a flat bed without using bedrails?: A Little Help needed moving to and from a bed to a chair (including a wheelchair)?: A Little Help needed standing up from a chair using your arms (e.g., wheelchair or bedside chair)?: A Little Help needed to walk in hospital room?: A Lot Help needed climbing 3-5 steps with a railing? : A Lot 6 Click Score: 16    End of Session Equipment Utilized During Treatment: Gait belt   Patient left: in chair;with call bell/phone within reach Nurse Communication: Mobility status PT Visit Diagnosis: Difficulty in walking, not elsewhere classified (R26.2)     Time: 1323-1350 PT Time Calculation (min) (ACUTE ONLY): 27 min  Charges:  $Gait Training: 8-22 mins $Therapeutic Activity: 8-22 mins                     Rica Koyanagi  PTA Acute  Rehabilitation Services Pager      [phone] Office      [phone]

## 2018-12-10 NOTE — Progress Notes (Signed)
Physical Therapy Treatment Patient Details Name: Randall PasturesJonathon A Axe MRN: [id number] DOB: 01/13/1966 Today's Date: 12/10/2018    History of Present Illness 53 yo male s/p R DA THA. 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  Attempted to show pt how to use gait belt to self assist R LE however unable to complete due to effort/use B UE's to just shift and scoot hips to EOB.  Pt tends to keep B LE ADD and move as one unit.  Present with very little hip and knee flex with what appears rigidity throughout.  Sitting EOB pt present with B LE in extension.  R LE with KI but even L LE remained extended.  75% VC's to bend L knee and "put foot under" prior to stand.  Pt unable to rise on won.  Bed had to be elevated highly before pt was able to rise still required Min Assist.  Discussed care transfer and how pt was going to get in/out car showing limited flex and lower car height.  Assisted with amb but a limited distance.  General Gait Details: great difficulty with excessive WBing thru B UE's on walker and very short steps with minimal flex B hips/knees as well as B PF.  VERY narrow BOS and unsteady.  Limited distance due to effort/fatigue.  Attempted stairs 3 times with 3 different approaches.  All unable to complete.  Pt unable to weight shift and unable to lift L LE 6 inches to step up. Will see pt again this afternoon for more stair training.   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 Magnus IvanBlackman to improve knee extension and prevent pt from going into excessive   hip/knee flexion Required Braces or Orthoses: Knee Immobilizer - Right Restrictions Weight Bearing Restrictions: No Other Position/Activity Restrictions: WBAT    Mobility  Bed Mobility Overal bed mobility: Needs Assistance Bed Mobility: Supine to Sit     Supine to sit: Min assist     General bed mobility comments: assist with LEs plus increased time and effort to scoot to EOB  Transfers Overall transfer level: Needs assistance Equipment used: Rolling walker (2 wheeled) Transfers: Sit to/from Stand Sit to Stand: Min assist;From elevated surface         General transfer comment: assist to safety rise, bed ht elevated; cues and manual facilitation of right  knee/hip extension, WBing through RLE  to decr flexor tone  Ambulation/Gait Ambulation/Gait assistance: Min assist;Mod assist Gait Distance (Feet): 12 Feet Assistive device: Rolling walker (2 wheeled) Gait Pattern/deviations: Step-to pattern;Decreased weight shift to right;Trunk flexed;Narrow base of support Gait velocity: decreased   General Gait Details: great difficulty with excessive WBing thru B UE's on walker and very short steps with minimal flex B hips/knees as well as B PF.  VERY narrow BOS and unsteady.  Limited distance due to effort/fatigue.   Stairs    attempted 3 times but pt was unable to complete even one step         Wheelchair Mobility    Modified Rankin (Stroke Patients Only)  Balance                                            Cognition Arousal/Alertness: Awake/alert Behavior During Therapy: WFL for tasks assessed/performed Overall Cognitive Status: Within Functional Limits for tasks assessed                                        Exercises      General Comments        Pertinent Vitals/Pain Pain Assessment: 0-10 Pain Score: 3  Pain Location: right hip Pain Descriptors / Indicators: Grimacing;Operative site guarding;Spasm Pain  Intervention(s): Monitored during session;Premedicated before session;Ice applied;Repositioned    Home Living                      Prior Function            PT Goals (current goals can now be found in the care plan section) Progress towards PT goals: Progressing toward goals    Frequency    7X/week      PT Plan Current plan remains appropriate    Co-evaluation              AM-PAC PT "6 Clicks" Mobility   Outcome Measure  Help needed turning from your back to your side while in a flat bed without using bedrails?: A Little Help needed moving from lying on your back to sitting on the side of a flat bed without using bedrails?: A Little Help needed moving to and from a bed to a chair (including a wheelchair)?: A Little Help needed standing up from a chair using your arms (e.g., wheelchair or bedside chair)?: A Little Help needed to walk in hospital room?: A Lot Help needed climbing 3-5 steps with a railing? : A Lot 6 Click Score: 16    End of Session Equipment Utilized During Treatment: Gait belt;Right knee immobilizer Activity Tolerance: Other (comment)(limited) Patient left: in chair Nurse Communication: Mobility status PT Visit Diagnosis: Difficulty in walking, not elsewhere classified (R26.2)     Time: 1015-1040 PT Time Calculation (min) (ACUTE ONLY): 25 min  Charges:  $Gait Training: 8-22 mins $Therapeutic Activity: 8-22 mins                     {Suraj Ramdass  PTA Acute  Rehabilitation Services Pager      [phone] Office      [phone]

## 2018-12-10 NOTE — Evaluation (Signed)
Occupational Therapy Evaluation Patient Details Name: [name] MRN: [id number] DOB: [birth year] Today's Date: 12/10/2018    History of Present Illness 53 yo male s/p R DA THA. 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 Pt will benefit from skilled OT to increase their safety and independence with ADL and functional mobility for ADL to facilitate discharge to venue listed below.     Pt needs significant A with ADL activity.  Pt state he has PRN A but not 24/7.  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 Ninfa Linden to improve knee extension and prevent pt from going into excessive  hip/knee flexion Required Braces or Orthoses: Knee Immobilizer - Right Restrictions Weight Bearing Restrictions: No Other Position/Activity Restrictions: WBAT      Mobility Bed Mobility Overal bed mobility: Needs Assistance Bed Mobility: Supine to Sit     Supine to sit: Min assist     General bed mobility comments: pt in chair  Transfers Overall transfer level: Needs assistance Equipment used: Rolling walker (2 wheeled) Transfers: Sit to/from Stand Sit to Stand: Min assist;From elevated surface         General transfer comment: assist to safety rise, bed ht elevated; cues and manual facilitation of right  knee/hip extension, WBing through RLE  to decr flexor tone    Balance Overall balance assessment: Needs assistance         Standing balance support: Bilateral upper extremity supported Standing balance-Leahy Scale: Poor Standing balance comment: reliant on UEs                             ADL either performed or assessed with clinical judgement   ADL Overall ADL's : Needs assistance/impaired Eating/Feeding: Set up;Sitting   Grooming: Set up;Sitting   Upper Body Bathing: Set up;Sitting   Lower Body Bathing: Moderate assistance;Sit to/from stand;Cueing for safety;Cueing for compensatory techniques;With adaptive equipment   Upper Body Dressing : Set up;Sitting   Lower Body Dressing: Moderate assistance;Sit to/from stand;Cueing for compensatory techniques;Cueing for safety;Cueing for sequencing;With adaptive equipment       Toileting- Clothing Manipulation and Hygiene: Moderate assistance;Sit to/from stand;Cueing for sequencing;Cueing for compensatory techniques;Cueing for safety         General ADL Comments: Long reacher issued. Pt needed increased time and encouragement.  Pt states family will A PRN but not 24/7                  Pertinent Vitals/Pain Pain Assessment: 0-10 Pain Score: 3  Pain Location: right hip Pain Descriptors / Indicators: Grimacing;Operative site guarding;Sore Pain Intervention(s): Monitored during session;Premedicated before session;Ice applied;Repositioned     Hand Dominance     Extremity/Trunk Assessment Upper Extremity Assessment Upper Extremity Assessment: Overall WFL for tasks assessed           Communication Communication Communication: No difficulties   Cognition Arousal/Alertness: Awake/alert Behavior During Therapy: WFL for tasks assessed/performed Overall Cognitive Status: Within Functional Limits for tasks assessed  General Comments   Long reacher issued            Home Living Family/patient expects to be discharged to:: Private residence Living Arrangements: Other relatives                 Bathroom Shower/Tub: Chief Strategy OfficerTub/shower unit   Bathroom Toilet: Standard         Additional Comments: 3 n 1 delivered      Prior Functioning/Environment  Level of Independence: Independent                 OT Problem List: Decreased strength;Decreased range of motion;Impaired balance (sitting and/or standing)      OT Treatment/Interventions: Self-care/ADL training;Patient/family education;DME and/or AE instruction    OT Goals(Current goals can be found in the care plan section) Acute Rehab OT Goals Patient Stated Goal: home today OT Goal Formulation: With patient Time For Goal Achievement: 12/18/18 Potential to Achieve Goals: Good  OT Frequency: Min 2X/week   Barriers to D/C:               AM-PAC OT "6 Clicks" Daily Activity     Outcome Measure Help from another person eating meals?: None Help from another person taking care of personal grooming?: None Help from another person toileting, which includes using toliet, bedpan, or urinal?: A Little Help from another person bathing (including washing, rinsing, drying)?: A Little Help from another person to put on and taking off regular upper body clothing?: A Little Help from another person to put on and taking off regular lower body clothing?: A Lot 6 Click Score: 19   End of Session Equipment Utilized During Treatment: Rolling walker;Gait belt Nurse Communication: Mobility status  Activity Tolerance: Patient tolerated treatment well Patient left: in chair;with call bell/phone within reach  OT Visit Diagnosis: Unsteadiness on feet (R26.81);Muscle weakness (generalized) (M62.81)                Time: [id number] OT Time Calculation (min): 25 min Charges:  OT General Charges $OT Visit: 1 Visit OT Evaluation $OT Eval Moderate Complexity: 1 Mod OT Treatments $Self Care/Home Management : 8-22 mins  Lise AuerLori Theresea Trautmann, OT Acute Rehabilitation Services [phone] Office- [phone]     Elnoria Livingston, Karin GoldenLorraine D 12/10/2018, 1:39 PM

## 2018-12-10 NOTE — Care Plan (Signed)
RNCM met with patient in his hospital room today. He was sitting up in bed and reports he feels he is ready to go home today. Has all DME that has been delivered to his room already (BSC/FWW). Patient reported he had a walker during pre-op conversation, but he verbalized over the weekend that this was a 4 wheeled walker. F/U appointment with MD reviewed. Also reminded that HHPT has been set up by [facility] with [place] and someone will be in touch to schedule his initial evaluation once discharged. [facility] information sheet provided to patient in person today. Patient aware of how to reach [facility] for any additional questions or needs. Jamse Arn, RN, BSN, [place] [phone].

## 2018-12-11 ENCOUNTER — Telehealth: Payer: Self-pay

## 2018-12-11 DIAGNOSIS — Z471 Aftercare following joint replacement surgery: Secondary | ICD-10-CM | POA: Diagnosis not present

## 2018-12-11 DIAGNOSIS — M24561 Contracture, right knee: Secondary | ICD-10-CM | POA: Diagnosis not present

## 2018-12-11 DIAGNOSIS — J45909 Unspecified asthma, uncomplicated: Secondary | ICD-10-CM | POA: Diagnosis not present

## 2018-12-11 DIAGNOSIS — Z96641 Presence of right artificial hip joint: Secondary | ICD-10-CM | POA: Diagnosis not present

## 2018-12-11 NOTE — Telephone Encounter (Signed)
Ronalee Belts PT with [facility] called about patient. He was asking if he was part of a bundle and would he be referred to outpatient therapy? Please call to discuss [phone]

## 2018-12-12 ENCOUNTER — Telehealth: Payer: Self-pay

## 2018-12-12 ENCOUNTER — Telehealth: Payer: Self-pay | Admitting: *Deleted

## 2018-12-12 NOTE — Care Plan (Signed)
RNCM attempted Ortho bundle discharge call to patient today after discharge from hospital on Monday, 12/10/2018. Patient seen in hospital on 12/10/2018 prior to discharge. Requested call back so that [facility] can discuss current status. Also noted that HHPT has started with [facility] on Tuesday, 12/11/2018. Spoke with treating therapist and discussed follow up appointment as well as therapy needs. Will leave home health visits open until f/u with MD on 12/24/18 and will have MD review progress at that time to determine if patient needs to progress to OPPT or if he will need additional home health therapy. RNCM made aware that another referral for HHPT was made to [facility] from office.  Contacted liaison and made aware [facility] is already active and canceled referral.

## 2018-12-12 NOTE — Telephone Encounter (Signed)
[name] with [facility] would like to let Dr. Ninfa Linden know that services will start for patient on Saturday, 12/15/2018.

## 2018-12-12 NOTE — Telephone Encounter (Signed)
Ortho bundle call attempted.

## 2018-12-13 ENCOUNTER — Telehealth: Payer: Self-pay | Admitting: *Deleted

## 2018-12-13 DIAGNOSIS — Z471 Aftercare following joint replacement surgery: Secondary | ICD-10-CM | POA: Diagnosis not present

## 2018-12-13 DIAGNOSIS — J45909 Unspecified asthma, uncomplicated: Secondary | ICD-10-CM | POA: Diagnosis not present

## 2018-12-13 DIAGNOSIS — Z96641 Presence of right artificial hip joint: Secondary | ICD-10-CM | POA: Diagnosis not present

## 2018-12-13 DIAGNOSIS — M24561 Contracture, right knee: Secondary | ICD-10-CM | POA: Diagnosis not present

## 2018-12-13 NOTE — Telephone Encounter (Signed)
Ortho Bundle D/C call completed. 

## 2018-12-13 NOTE — Telephone Encounter (Signed)
Ortho bundle discharge call completed. 

## 2018-12-13 NOTE — Care Plan (Signed)
RNCM spoke with patient who reports he is doing well at home. Having no current issues. HHPT has been out to his home twice this week. Will be coming again tomorrow. Reminded of his initial post-op appointment on 12/24/18 at 1:30 pm with Dr. Ninfa Linden. Reminded of how to contact RNCM.

## 2018-12-14 DIAGNOSIS — J45909 Unspecified asthma, uncomplicated: Secondary | ICD-10-CM | POA: Diagnosis not present

## 2018-12-14 DIAGNOSIS — Z471 Aftercare following joint replacement surgery: Secondary | ICD-10-CM | POA: Diagnosis not present

## 2018-12-14 DIAGNOSIS — Z96641 Presence of right artificial hip joint: Secondary | ICD-10-CM | POA: Diagnosis not present

## 2018-12-14 DIAGNOSIS — M24561 Contracture, right knee: Secondary | ICD-10-CM | POA: Diagnosis not present

## 2018-12-17 DIAGNOSIS — Z471 Aftercare following joint replacement surgery: Secondary | ICD-10-CM | POA: Diagnosis not present

## 2018-12-17 DIAGNOSIS — Z96641 Presence of right artificial hip joint: Secondary | ICD-10-CM | POA: Diagnosis not present

## 2018-12-17 DIAGNOSIS — M24561 Contracture, right knee: Secondary | ICD-10-CM | POA: Diagnosis not present

## 2018-12-17 DIAGNOSIS — J45909 Unspecified asthma, uncomplicated: Secondary | ICD-10-CM | POA: Diagnosis not present

## 2018-12-19 DIAGNOSIS — Z96641 Presence of right artificial hip joint: Secondary | ICD-10-CM | POA: Diagnosis not present

## 2018-12-19 DIAGNOSIS — M24561 Contracture, right knee: Secondary | ICD-10-CM | POA: Diagnosis not present

## 2018-12-19 DIAGNOSIS — Z471 Aftercare following joint replacement surgery: Secondary | ICD-10-CM | POA: Diagnosis not present

## 2018-12-19 DIAGNOSIS — J45909 Unspecified asthma, uncomplicated: Secondary | ICD-10-CM | POA: Diagnosis not present

## 2018-12-20 ENCOUNTER — Telehealth: Payer: Self-pay | Admitting: *Deleted

## 2018-12-20 NOTE — Telephone Encounter (Signed)
Ortho bundle 1 week post-discharge call attempted; Left VM for patient requesting call back.

## 2018-12-21 ENCOUNTER — Telehealth: Payer: Self-pay | Admitting: *Deleted

## 2018-12-21 DIAGNOSIS — J45909 Unspecified asthma, uncomplicated: Secondary | ICD-10-CM | POA: Diagnosis not present

## 2018-12-21 DIAGNOSIS — Z471 Aftercare following joint replacement surgery: Secondary | ICD-10-CM | POA: Diagnosis not present

## 2018-12-21 DIAGNOSIS — M24561 Contracture, right knee: Secondary | ICD-10-CM | POA: Diagnosis not present

## 2018-12-21 DIAGNOSIS — Z96641 Presence of right artificial hip joint: Secondary | ICD-10-CM | POA: Diagnosis not present

## 2018-12-21 NOTE — Telephone Encounter (Signed)
7 day Ortho Bundle call completed.

## 2018-12-24 ENCOUNTER — Encounter: Payer: Self-pay | Admitting: Orthopaedic Surgery

## 2018-12-24 ENCOUNTER — Other Ambulatory Visit: Payer: Self-pay

## 2018-12-24 ENCOUNTER — Telehealth: Payer: Self-pay | Admitting: *Deleted

## 2018-12-24 ENCOUNTER — Ambulatory Visit (INDEPENDENT_AMBULATORY_CARE_PROVIDER_SITE_OTHER): Payer: Medicare Other | Admitting: Orthopaedic Surgery

## 2018-12-24 DIAGNOSIS — Z96641 Presence of right artificial hip joint: Secondary | ICD-10-CM

## 2018-12-24 MED ORDER — METHOCARBAMOL 500 MG PO TABS: 500.0000 mg | ORAL_TABLET | Freq: Four times a day (QID) | ORAL | 1 refills | Status: DC | PRN

## 2018-12-24 MED ORDER — OXYCODONE HCL 5 MG PO TABS: 5.0000 mg | ORAL_TABLET | Freq: Four times a day (QID) | ORAL | 0 refills | Status: AC | PRN

## 2018-12-24 NOTE — Progress Notes (Signed)
The patient is 2 weeks status post a right total hip arthroplasty.  Hip replacement did well.  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  That has persisted.  He is working with home therapy.  He feels like he is doing well.  He has been taking an 81 mg aspirin twice daily.  On examination of his right hip his staples removed and Steri-Strips applied.  His hip shows a small seroma on the right side and I did aspirate about 20 to 30 cc of fluid from the area.  He still wants to hold his knee in a flexed position.  It is essential that he work on getting his knee extended.  We will transition him from home therapy to outpatient physical therapy because hopefully I will help him improve his mobility.  I did send in some more oxycodone and Robaxin for him.  All question concerns were answered and addressed.  We will see him back in 4 weeks to see how is doing overall but no x-rays are needed.  Again I stressed the importance of him trying to get his knee and hip straight on his own as well as aggressive therapy.

## 2018-12-24 NOTE — Care Plan (Signed)
RNCM met with patient in office during his 2 week post-op appointment with Dr. Ninfa Linden. He is doing well s/p R-Ant. Total hip replacement. HHPT continues and MD agrees to continue for another 2 weeks through 01/04/19, then can transition over to [place]. Patient is agreeable. Right knee is still extremely contractured due to pain from hip. Recommended aggressive therapy to help with getting knee to straighten. Very ataxic gait noted with rolling walker. Staples removed with steri-strips applied. Small seroma noted and drained today by MD. Follow up scheduled for 4 weeks on 01/24/19. Refill of Oxycodone and Robaxin e-scribed to patient's pharmacy.

## 2018-12-24 NOTE — Telephone Encounter (Signed)
RNCM Ortho bundle call (In office) completed.

## 2018-12-25 DIAGNOSIS — Z471 Aftercare following joint replacement surgery: Secondary | ICD-10-CM | POA: Diagnosis not present

## 2018-12-25 DIAGNOSIS — M24561 Contracture, right knee: Secondary | ICD-10-CM | POA: Diagnosis not present

## 2018-12-25 DIAGNOSIS — J45909 Unspecified asthma, uncomplicated: Secondary | ICD-10-CM | POA: Diagnosis not present

## 2018-12-25 DIAGNOSIS — Z96641 Presence of right artificial hip joint: Secondary | ICD-10-CM | POA: Diagnosis not present

## 2018-12-27 DIAGNOSIS — Z471 Aftercare following joint replacement surgery: Secondary | ICD-10-CM | POA: Diagnosis not present

## 2018-12-27 DIAGNOSIS — Z96641 Presence of right artificial hip joint: Secondary | ICD-10-CM | POA: Diagnosis not present

## 2018-12-27 DIAGNOSIS — J45909 Unspecified asthma, uncomplicated: Secondary | ICD-10-CM | POA: Diagnosis not present

## 2018-12-27 DIAGNOSIS — M24561 Contracture, right knee: Secondary | ICD-10-CM | POA: Diagnosis not present

## 2019-01-01 DIAGNOSIS — M24561 Contracture, right knee: Secondary | ICD-10-CM | POA: Diagnosis not present

## 2019-01-01 DIAGNOSIS — J45909 Unspecified asthma, uncomplicated: Secondary | ICD-10-CM | POA: Diagnosis not present

## 2019-01-01 DIAGNOSIS — Z96641 Presence of right artificial hip joint: Secondary | ICD-10-CM | POA: Diagnosis not present

## 2019-01-01 DIAGNOSIS — Z471 Aftercare following joint replacement surgery: Secondary | ICD-10-CM | POA: Diagnosis not present

## 2019-01-02 ENCOUNTER — Telehealth: Payer: Self-pay | Admitting: *Deleted

## 2019-01-02 NOTE — Care Plan (Signed)
RNCM assisted with scheduling Outpatient Physical Therapy for patient as ordered by Dr. Ninfa Linden. Patient to finish up with Home Health PT this week with [facility]. RNCM confirmed with treating therapist that patient will be discharged on Friday, 01/04/19 from [facility]. Made aware that patient is now scheduled with OPPT with [facility] on Tuesday, 01/08/19 at 10:00 am. Order and demographics faxed to therapy location. Eval and treat S/P R-THA as well as right knee due to contracture of knee related to hip pain. Patient is aware and agreeable to time/date of OPPT scheduled.

## 2019-01-02 NOTE — Telephone Encounter (Signed)
Ortho bundle case management call completed. See care plan note.

## 2019-01-04 DIAGNOSIS — M24561 Contracture, right knee: Secondary | ICD-10-CM | POA: Diagnosis not present

## 2019-01-04 DIAGNOSIS — J45909 Unspecified asthma, uncomplicated: Secondary | ICD-10-CM | POA: Diagnosis not present

## 2019-01-04 DIAGNOSIS — Z96641 Presence of right artificial hip joint: Secondary | ICD-10-CM | POA: Diagnosis not present

## 2019-01-04 DIAGNOSIS — Z471 Aftercare following joint replacement surgery: Secondary | ICD-10-CM | POA: Diagnosis not present

## 2019-01-08 ENCOUNTER — Telehealth: Payer: Self-pay | Admitting: *Deleted

## 2019-01-08 DIAGNOSIS — M25561 Pain in right knee: Secondary | ICD-10-CM | POA: Diagnosis not present

## 2019-01-08 DIAGNOSIS — M25551 Pain in right hip: Secondary | ICD-10-CM | POA: Diagnosis not present

## 2019-01-08 DIAGNOSIS — M21261 Flexion deformity, right knee: Secondary | ICD-10-CM | POA: Diagnosis not present

## 2019-01-08 NOTE — Care Plan (Signed)
RNCM made 30 day phone call to patient to check status. Patient verbalized he had been to OPPT today for his first appointment. He indicated that he was pleased and will be going back on Friday. Feels this will be even more beneficial for him. Reviewed 30 day patient satisfaction survey provided by TOM/THN that is replacing the previous 10 question survey. 1. Prior to surgery I was provided sufficient education regarding my surgery and the bundle program. Patient's answer- Strongly agree 2. I was satisfied with the care I received at the facility where my surgery was performed. Patient's answer- Strongly agree 3. Following surgery, I received sufficient postoperative care instructions. Patient's answer- Strongly agree 4. I would recommend my surgeon and this bundle program to others. Patient's answer- Strongly agree Reminded of upcoming appointment in September with Dr. Ninfa Linden. Patient was appreciative of call.

## 2019-01-08 NOTE — Telephone Encounter (Signed)
RNCM completed 30 day Ortho bundle call.

## 2019-01-11 DIAGNOSIS — M25561 Pain in right knee: Secondary | ICD-10-CM | POA: Diagnosis not present

## 2019-01-11 DIAGNOSIS — M21261 Flexion deformity, right knee: Secondary | ICD-10-CM | POA: Diagnosis not present

## 2019-01-11 DIAGNOSIS — M25551 Pain in right hip: Secondary | ICD-10-CM | POA: Diagnosis not present

## 2019-01-14 DIAGNOSIS — M21261 Flexion deformity, right knee: Secondary | ICD-10-CM | POA: Diagnosis not present

## 2019-01-14 DIAGNOSIS — M25551 Pain in right hip: Secondary | ICD-10-CM | POA: Diagnosis not present

## 2019-01-14 DIAGNOSIS — M25561 Pain in right knee: Secondary | ICD-10-CM | POA: Diagnosis not present

## 2019-01-16 DIAGNOSIS — M25561 Pain in right knee: Secondary | ICD-10-CM | POA: Diagnosis not present

## 2019-01-16 DIAGNOSIS — M25551 Pain in right hip: Secondary | ICD-10-CM | POA: Diagnosis not present

## 2019-01-16 DIAGNOSIS — M21261 Flexion deformity, right knee: Secondary | ICD-10-CM | POA: Diagnosis not present

## 2019-01-23 DIAGNOSIS — M25561 Pain in right knee: Secondary | ICD-10-CM | POA: Diagnosis not present

## 2019-01-23 DIAGNOSIS — M25551 Pain in right hip: Secondary | ICD-10-CM | POA: Diagnosis not present

## 2019-01-23 DIAGNOSIS — M21261 Flexion deformity, right knee: Secondary | ICD-10-CM | POA: Diagnosis not present

## 2019-01-24 ENCOUNTER — Ambulatory Visit (INDEPENDENT_AMBULATORY_CARE_PROVIDER_SITE_OTHER): Payer: Medicare Other | Admitting: Orthopaedic Surgery

## 2019-01-24 ENCOUNTER — Encounter: Payer: Self-pay | Admitting: Orthopaedic Surgery

## 2019-01-24 DIAGNOSIS — M25551 Pain in right hip: Secondary | ICD-10-CM | POA: Diagnosis not present

## 2019-01-24 DIAGNOSIS — M21261 Flexion deformity, right knee: Secondary | ICD-10-CM | POA: Diagnosis not present

## 2019-01-24 DIAGNOSIS — M25561 Pain in right knee: Secondary | ICD-10-CM | POA: Diagnosis not present

## 2019-01-24 DIAGNOSIS — Z96641 Presence of right artificial hip joint: Secondary | ICD-10-CM

## 2019-01-24 MED ORDER — METHOCARBAMOL 500 MG PO TABS: 500.0000 mg | ORAL_TABLET | Freq: Four times a day (QID) | ORAL | 2 refills | Status: AC | PRN

## 2019-01-24 NOTE — Progress Notes (Signed)
The patient comes in today at 6 weeks to 7 weeks status post a right total hip arthroplasty.  He ambulates with a rolling walker.  Based on this his gait I do feel there is an underlying neurologic disorder.  He has had significant flexion contractures and stiffness in both knees.  I thought the right knee was more related to just how he is managing his pain prior to having hip replacement and he had a contracture from that.  Seeing him walk today though again I am concerned about underlying muscular skeletal and neurologic disorder.  He has had neck issues in the past and potentially a neck surgery.  He has no issues with his left extremities at all.  He says his right hip is doing well after surgery and the only thing he takes is muscle relaxant that helped getting his knee straighter.  He is in outpatient physical therapy which I think is certainly been reasonable based on his lower extremity deconditioning and him being a fall risk.  He is ambulating with a rolling walker.  On exam his right operative hip is moving much smoother than it used to prior to surgery.  Both knees are stiff and have slight flexion contractures.  He does have weakness with plantarflexion of both feet.  His upper extremities have been normal.  We will see him back in 4 weeks after continued physical therapy.  At some point may need to refer him to a neurologist to see if there is an underlying neurologic disorder that is causing his gait to be the way it is.  I would like to see him back in 4 weeks for repeat exam.  All question concerns were answered addressed.  I will refill his muscle relaxant.

## 2019-01-25 NOTE — Care Plan (Signed)
RNCM met with patient in office for a 6 week f/u with Dr. Ninfa Linden. He reports his Right hip is doing well post-surgery. He continues to ambulate with a rolling walker and has a severely abnormal gait. MD discussed that his lower body contractures and ambulation may be related to an underlying neurological issues as opposed to just an arthritic hip. Patient should continue with OPPT as he feels this is helping. Taking a muscle relaxer, which really helps with his knee contracture. RNCM will contact [facility] in [place] to ensure that they are aware to continue with OPPT for another 4 weeks. F/U on 02/21/19 with MD. Refill of muscle relaxer sent to pharmacy.

## 2019-01-28 DIAGNOSIS — M25551 Pain in right hip: Secondary | ICD-10-CM | POA: Diagnosis not present

## 2019-01-28 DIAGNOSIS — M25561 Pain in right knee: Secondary | ICD-10-CM | POA: Diagnosis not present

## 2019-01-28 DIAGNOSIS — M21261 Flexion deformity, right knee: Secondary | ICD-10-CM | POA: Diagnosis not present

## 2019-01-30 DIAGNOSIS — M25561 Pain in right knee: Secondary | ICD-10-CM | POA: Diagnosis not present

## 2019-01-30 DIAGNOSIS — M21261 Flexion deformity, right knee: Secondary | ICD-10-CM | POA: Diagnosis not present

## 2019-01-30 DIAGNOSIS — M25551 Pain in right hip: Secondary | ICD-10-CM | POA: Diagnosis not present

## 2019-02-04 DIAGNOSIS — M25561 Pain in right knee: Secondary | ICD-10-CM | POA: Diagnosis not present

## 2019-02-04 DIAGNOSIS — M25551 Pain in right hip: Secondary | ICD-10-CM | POA: Diagnosis not present

## 2019-02-04 DIAGNOSIS — M21261 Flexion deformity, right knee: Secondary | ICD-10-CM | POA: Diagnosis not present

## 2019-02-06 DIAGNOSIS — M21261 Flexion deformity, right knee: Secondary | ICD-10-CM | POA: Diagnosis not present

## 2019-02-06 DIAGNOSIS — M25561 Pain in right knee: Secondary | ICD-10-CM | POA: Diagnosis not present

## 2019-02-06 DIAGNOSIS — M25551 Pain in right hip: Secondary | ICD-10-CM | POA: Diagnosis not present

## 2019-02-11 DIAGNOSIS — M21261 Flexion deformity, right knee: Secondary | ICD-10-CM | POA: Diagnosis not present

## 2019-02-11 DIAGNOSIS — M25551 Pain in right hip: Secondary | ICD-10-CM | POA: Diagnosis not present

## 2019-02-11 DIAGNOSIS — M25561 Pain in right knee: Secondary | ICD-10-CM | POA: Diagnosis not present

## 2019-02-13 DIAGNOSIS — M21261 Flexion deformity, right knee: Secondary | ICD-10-CM | POA: Diagnosis not present

## 2019-02-13 DIAGNOSIS — M25561 Pain in right knee: Secondary | ICD-10-CM | POA: Diagnosis not present

## 2019-02-13 DIAGNOSIS — M25551 Pain in right hip: Secondary | ICD-10-CM | POA: Diagnosis not present

## 2019-02-19 DIAGNOSIS — M25551 Pain in right hip: Secondary | ICD-10-CM | POA: Diagnosis not present

## 2019-02-19 DIAGNOSIS — M21261 Flexion deformity, right knee: Secondary | ICD-10-CM | POA: Diagnosis not present

## 2019-02-19 DIAGNOSIS — M25561 Pain in right knee: Secondary | ICD-10-CM | POA: Diagnosis not present

## 2019-02-21 ENCOUNTER — Ambulatory Visit (INDEPENDENT_AMBULATORY_CARE_PROVIDER_SITE_OTHER): Payer: Medicare Other | Admitting: Orthopaedic Surgery

## 2019-02-21 ENCOUNTER — Other Ambulatory Visit: Payer: Self-pay

## 2019-02-21 ENCOUNTER — Encounter: Payer: Self-pay | Admitting: Orthopaedic Surgery

## 2019-02-21 DIAGNOSIS — Z96641 Presence of right artificial hip joint: Secondary | ICD-10-CM

## 2019-02-21 NOTE — Progress Notes (Signed)
The patient is now 77 days status post a right total hip arthroplasty.  He says he is doing very well from that standpoint.  However my biggest concern is been whether or not he has some type of musculoskeletal disorder or neurologic disorder because of just seeing how he walks with his gait.  Both knees still remains significantly stiff.  He has difficulty with obtaining full extension in both knees.  Both legs to me are weak.  His right hip moves better having had a hip replacement both hips are stiff.  Overall he just looks like there is some muscle wasting of his legs.  He reports normal sensation though but certainly both legs are weak.  Even seeing him walk with his walker concerns me about his lower extremity weakness.  At this point I recommended he see his primary care physician and I may end up referring him to neurology at some point.  From an orthopedic standpoint I will need to see him back for 6 months.  At that visit I would like to have a standing low AP pelvis and lateral of his right operative hip.

## 2019-02-22 DIAGNOSIS — M25551 Pain in right hip: Secondary | ICD-10-CM | POA: Diagnosis not present

## 2019-02-22 DIAGNOSIS — M25561 Pain in right knee: Secondary | ICD-10-CM | POA: Diagnosis not present

## 2019-02-22 DIAGNOSIS — M21261 Flexion deformity, right knee: Secondary | ICD-10-CM | POA: Diagnosis not present

## 2019-02-25 DIAGNOSIS — M25561 Pain in right knee: Secondary | ICD-10-CM | POA: Diagnosis not present

## 2019-02-25 DIAGNOSIS — M21261 Flexion deformity, right knee: Secondary | ICD-10-CM | POA: Diagnosis not present

## 2019-02-25 DIAGNOSIS — M25551 Pain in right hip: Secondary | ICD-10-CM | POA: Diagnosis not present

## 2019-02-27 DIAGNOSIS — M25561 Pain in right knee: Secondary | ICD-10-CM | POA: Diagnosis not present

## 2019-02-27 DIAGNOSIS — M25551 Pain in right hip: Secondary | ICD-10-CM | POA: Diagnosis not present

## 2019-02-27 DIAGNOSIS — M21261 Flexion deformity, right knee: Secondary | ICD-10-CM | POA: Diagnosis not present

## 2019-03-04 DIAGNOSIS — M21261 Flexion deformity, right knee: Secondary | ICD-10-CM | POA: Diagnosis not present

## 2019-03-04 DIAGNOSIS — M25561 Pain in right knee: Secondary | ICD-10-CM | POA: Diagnosis not present

## 2019-03-04 DIAGNOSIS — M25551 Pain in right hip: Secondary | ICD-10-CM | POA: Diagnosis not present

## 2019-03-07 DIAGNOSIS — M25561 Pain in right knee: Secondary | ICD-10-CM | POA: Diagnosis not present

## 2019-03-07 DIAGNOSIS — M25551 Pain in right hip: Secondary | ICD-10-CM | POA: Diagnosis not present

## 2019-03-07 DIAGNOSIS — M21261 Flexion deformity, right knee: Secondary | ICD-10-CM | POA: Diagnosis not present

## 2019-03-11 DIAGNOSIS — M25551 Pain in right hip: Secondary | ICD-10-CM | POA: Diagnosis not present

## 2019-03-11 DIAGNOSIS — M25561 Pain in right knee: Secondary | ICD-10-CM | POA: Diagnosis not present

## 2019-03-11 DIAGNOSIS — M21261 Flexion deformity, right knee: Secondary | ICD-10-CM | POA: Diagnosis not present

## 2019-03-14 DIAGNOSIS — M25561 Pain in right knee: Secondary | ICD-10-CM | POA: Diagnosis not present

## 2019-03-14 DIAGNOSIS — M21261 Flexion deformity, right knee: Secondary | ICD-10-CM | POA: Diagnosis not present

## 2019-03-14 DIAGNOSIS — M25551 Pain in right hip: Secondary | ICD-10-CM | POA: Diagnosis not present

## 2019-03-15 ENCOUNTER — Telehealth: Payer: Self-pay | Admitting: *Deleted

## 2019-03-15 NOTE — Telephone Encounter (Signed)
90 day Ortho bundle call and survey completed. 

## 2019-03-15 NOTE — Care Plan (Signed)
RNCM call to patient for 90 day status. He verbalized overall he is still doing well. Patient's course was complication by contracture of the knee. MD at last visit was unsure if this was related to prolonged pain from the hip or if something else neurologically was complicating the problem. Reviewed 90 day survey. Reminded to call RNCM if any further needs.

## 2019-03-20 DIAGNOSIS — M25561 Pain in right knee: Secondary | ICD-10-CM | POA: Diagnosis not present

## 2019-03-20 DIAGNOSIS — M21261 Flexion deformity, right knee: Secondary | ICD-10-CM | POA: Diagnosis not present

## 2019-03-20 DIAGNOSIS — M25551 Pain in right hip: Secondary | ICD-10-CM | POA: Diagnosis not present

## 2019-03-22 DIAGNOSIS — M21261 Flexion deformity, right knee: Secondary | ICD-10-CM | POA: Diagnosis not present

## 2019-03-22 DIAGNOSIS — M25551 Pain in right hip: Secondary | ICD-10-CM | POA: Diagnosis not present

## 2019-03-22 DIAGNOSIS — M25561 Pain in right knee: Secondary | ICD-10-CM | POA: Diagnosis not present

## 2019-03-26 DIAGNOSIS — M25551 Pain in right hip: Secondary | ICD-10-CM | POA: Diagnosis not present

## 2019-03-26 DIAGNOSIS — M25561 Pain in right knee: Secondary | ICD-10-CM | POA: Diagnosis not present

## 2019-03-26 DIAGNOSIS — M21261 Flexion deformity, right knee: Secondary | ICD-10-CM | POA: Diagnosis not present

## 2019-04-02 DIAGNOSIS — M25551 Pain in right hip: Secondary | ICD-10-CM | POA: Diagnosis not present

## 2019-04-02 DIAGNOSIS — M25561 Pain in right knee: Secondary | ICD-10-CM | POA: Diagnosis not present

## 2019-04-02 DIAGNOSIS — M21261 Flexion deformity, right knee: Secondary | ICD-10-CM | POA: Diagnosis not present

## 2019-04-04 DIAGNOSIS — M21261 Flexion deformity, right knee: Secondary | ICD-10-CM | POA: Diagnosis not present

## 2019-04-04 DIAGNOSIS — M25551 Pain in right hip: Secondary | ICD-10-CM | POA: Diagnosis not present

## 2019-04-04 DIAGNOSIS — M25561 Pain in right knee: Secondary | ICD-10-CM | POA: Diagnosis not present

## 2019-04-10 DIAGNOSIS — M25551 Pain in right hip: Secondary | ICD-10-CM | POA: Diagnosis not present

## 2019-04-10 DIAGNOSIS — M21261 Flexion deformity, right knee: Secondary | ICD-10-CM | POA: Diagnosis not present

## 2019-04-10 DIAGNOSIS — M25561 Pain in right knee: Secondary | ICD-10-CM | POA: Diagnosis not present

## 2019-04-16 DIAGNOSIS — M25551 Pain in right hip: Secondary | ICD-10-CM | POA: Diagnosis not present

## 2019-04-16 DIAGNOSIS — M25561 Pain in right knee: Secondary | ICD-10-CM | POA: Diagnosis not present

## 2019-04-16 DIAGNOSIS — M21261 Flexion deformity, right knee: Secondary | ICD-10-CM | POA: Diagnosis not present

## 2019-04-18 DIAGNOSIS — M21261 Flexion deformity, right knee: Secondary | ICD-10-CM | POA: Diagnosis not present

## 2019-04-18 DIAGNOSIS — M25561 Pain in right knee: Secondary | ICD-10-CM | POA: Diagnosis not present

## 2019-04-18 DIAGNOSIS — M25551 Pain in right hip: Secondary | ICD-10-CM | POA: Diagnosis not present

## 2019-04-22 DIAGNOSIS — Z1211 Encounter for screening for malignant neoplasm of colon: Secondary | ICD-10-CM | POA: Diagnosis not present

## 2019-04-22 DIAGNOSIS — Z0000 Encounter for general adult medical examination without abnormal findings: Secondary | ICD-10-CM | POA: Diagnosis not present

## 2019-04-22 DIAGNOSIS — Z7189 Other specified counseling: Secondary | ICD-10-CM | POA: Diagnosis not present

## 2019-04-22 DIAGNOSIS — E7800 Pure hypercholesterolemia, unspecified: Secondary | ICD-10-CM | POA: Diagnosis not present

## 2019-04-22 DIAGNOSIS — Z1339 Encounter for screening examination for other mental health and behavioral disorders: Secondary | ICD-10-CM | POA: Diagnosis not present

## 2019-04-22 DIAGNOSIS — R5383 Other fatigue: Secondary | ICD-10-CM | POA: Diagnosis not present

## 2019-04-22 DIAGNOSIS — Z6822 Body mass index (BMI) 22.0-22.9, adult: Secondary | ICD-10-CM | POA: Diagnosis not present

## 2019-04-22 DIAGNOSIS — J449 Chronic obstructive pulmonary disease, unspecified: Secondary | ICD-10-CM | POA: Diagnosis not present

## 2019-04-22 DIAGNOSIS — Z125 Encounter for screening for malignant neoplasm of prostate: Secondary | ICD-10-CM | POA: Diagnosis not present

## 2019-04-22 DIAGNOSIS — Z299 Encounter for prophylactic measures, unspecified: Secondary | ICD-10-CM | POA: Diagnosis not present

## 2019-04-22 DIAGNOSIS — Z79899 Other long term (current) drug therapy: Secondary | ICD-10-CM | POA: Diagnosis not present

## 2019-04-22 DIAGNOSIS — Z1331 Encounter for screening for depression: Secondary | ICD-10-CM | POA: Diagnosis not present

## 2019-04-23 DIAGNOSIS — M25551 Pain in right hip: Secondary | ICD-10-CM | POA: Diagnosis not present

## 2019-04-23 DIAGNOSIS — M21261 Flexion deformity, right knee: Secondary | ICD-10-CM | POA: Diagnosis not present

## 2019-04-23 DIAGNOSIS — M25561 Pain in right knee: Secondary | ICD-10-CM | POA: Diagnosis not present

## 2019-05-20 DIAGNOSIS — M25561 Pain in right knee: Secondary | ICD-10-CM | POA: Diagnosis not present

## 2019-05-20 DIAGNOSIS — M21261 Flexion deformity, right knee: Secondary | ICD-10-CM | POA: Diagnosis not present

## 2019-05-20 DIAGNOSIS — M25551 Pain in right hip: Secondary | ICD-10-CM | POA: Diagnosis not present

## 2019-05-22 DIAGNOSIS — M25561 Pain in right knee: Secondary | ICD-10-CM | POA: Diagnosis not present

## 2019-05-22 DIAGNOSIS — M25551 Pain in right hip: Secondary | ICD-10-CM | POA: Diagnosis not present

## 2019-05-22 DIAGNOSIS — M21261 Flexion deformity, right knee: Secondary | ICD-10-CM | POA: Diagnosis not present

## 2019-05-27 DIAGNOSIS — M21261 Flexion deformity, right knee: Secondary | ICD-10-CM | POA: Diagnosis not present

## 2019-05-27 DIAGNOSIS — M25561 Pain in right knee: Secondary | ICD-10-CM | POA: Diagnosis not present

## 2019-05-27 DIAGNOSIS — M25551 Pain in right hip: Secondary | ICD-10-CM | POA: Diagnosis not present

## 2019-05-30 DIAGNOSIS — M25551 Pain in right hip: Secondary | ICD-10-CM | POA: Diagnosis not present

## 2019-05-30 DIAGNOSIS — M25561 Pain in right knee: Secondary | ICD-10-CM | POA: Diagnosis not present

## 2019-05-30 DIAGNOSIS — M21261 Flexion deformity, right knee: Secondary | ICD-10-CM | POA: Diagnosis not present

## 2019-06-03 DIAGNOSIS — M25561 Pain in right knee: Secondary | ICD-10-CM | POA: Diagnosis not present

## 2019-06-03 DIAGNOSIS — M25551 Pain in right hip: Secondary | ICD-10-CM | POA: Diagnosis not present

## 2019-06-03 DIAGNOSIS — M21261 Flexion deformity, right knee: Secondary | ICD-10-CM | POA: Diagnosis not present

## 2019-06-06 DIAGNOSIS — M21261 Flexion deformity, right knee: Secondary | ICD-10-CM | POA: Diagnosis not present

## 2019-06-06 DIAGNOSIS — M25561 Pain in right knee: Secondary | ICD-10-CM | POA: Diagnosis not present

## 2019-06-06 DIAGNOSIS — M25551 Pain in right hip: Secondary | ICD-10-CM | POA: Diagnosis not present

## 2019-06-10 DIAGNOSIS — M25551 Pain in right hip: Secondary | ICD-10-CM | POA: Diagnosis not present

## 2019-06-10 DIAGNOSIS — M25561 Pain in right knee: Secondary | ICD-10-CM | POA: Diagnosis not present

## 2019-06-10 DIAGNOSIS — M21261 Flexion deformity, right knee: Secondary | ICD-10-CM | POA: Diagnosis not present

## 2019-06-13 DIAGNOSIS — M25561 Pain in right knee: Secondary | ICD-10-CM | POA: Diagnosis not present

## 2019-06-13 DIAGNOSIS — M25551 Pain in right hip: Secondary | ICD-10-CM | POA: Diagnosis not present

## 2019-06-13 DIAGNOSIS — M21261 Flexion deformity, right knee: Secondary | ICD-10-CM | POA: Diagnosis not present

## 2019-06-18 DIAGNOSIS — M25551 Pain in right hip: Secondary | ICD-10-CM | POA: Diagnosis not present

## 2019-06-18 DIAGNOSIS — M21261 Flexion deformity, right knee: Secondary | ICD-10-CM | POA: Diagnosis not present

## 2019-06-18 DIAGNOSIS — M25561 Pain in right knee: Secondary | ICD-10-CM | POA: Diagnosis not present

## 2019-06-21 DIAGNOSIS — M25561 Pain in right knee: Secondary | ICD-10-CM | POA: Diagnosis not present

## 2019-06-21 DIAGNOSIS — M25551 Pain in right hip: Secondary | ICD-10-CM | POA: Diagnosis not present

## 2019-06-21 DIAGNOSIS — M21261 Flexion deformity, right knee: Secondary | ICD-10-CM | POA: Diagnosis not present

## 2019-06-25 DIAGNOSIS — M21261 Flexion deformity, right knee: Secondary | ICD-10-CM | POA: Diagnosis not present

## 2019-06-25 DIAGNOSIS — M25561 Pain in right knee: Secondary | ICD-10-CM | POA: Diagnosis not present

## 2019-06-25 DIAGNOSIS — M25551 Pain in right hip: Secondary | ICD-10-CM | POA: Diagnosis not present

## 2019-06-27 DIAGNOSIS — M21261 Flexion deformity, right knee: Secondary | ICD-10-CM | POA: Diagnosis not present

## 2019-06-27 DIAGNOSIS — M25561 Pain in right knee: Secondary | ICD-10-CM | POA: Diagnosis not present

## 2019-06-27 DIAGNOSIS — M25551 Pain in right hip: Secondary | ICD-10-CM | POA: Diagnosis not present

## 2019-07-01 DIAGNOSIS — M25551 Pain in right hip: Secondary | ICD-10-CM | POA: Diagnosis not present

## 2019-07-01 DIAGNOSIS — M25561 Pain in right knee: Secondary | ICD-10-CM | POA: Diagnosis not present

## 2019-07-01 DIAGNOSIS — M21261 Flexion deformity, right knee: Secondary | ICD-10-CM | POA: Diagnosis not present

## 2019-07-08 DIAGNOSIS — M25551 Pain in right hip: Secondary | ICD-10-CM | POA: Diagnosis not present

## 2019-07-08 DIAGNOSIS — M25561 Pain in right knee: Secondary | ICD-10-CM | POA: Diagnosis not present

## 2019-07-08 DIAGNOSIS — M21261 Flexion deformity, right knee: Secondary | ICD-10-CM | POA: Diagnosis not present

## 2019-07-11 DIAGNOSIS — M25561 Pain in right knee: Secondary | ICD-10-CM | POA: Diagnosis not present

## 2019-07-11 DIAGNOSIS — M21261 Flexion deformity, right knee: Secondary | ICD-10-CM | POA: Diagnosis not present

## 2019-07-11 DIAGNOSIS — M25551 Pain in right hip: Secondary | ICD-10-CM | POA: Diagnosis not present

## 2019-07-15 DIAGNOSIS — M25561 Pain in right knee: Secondary | ICD-10-CM | POA: Diagnosis not present

## 2019-07-15 DIAGNOSIS — M21261 Flexion deformity, right knee: Secondary | ICD-10-CM | POA: Diagnosis not present

## 2019-07-15 DIAGNOSIS — M25551 Pain in right hip: Secondary | ICD-10-CM | POA: Diagnosis not present

## 2019-07-18 DIAGNOSIS — M25551 Pain in right hip: Secondary | ICD-10-CM | POA: Diagnosis not present

## 2019-07-18 DIAGNOSIS — M25561 Pain in right knee: Secondary | ICD-10-CM | POA: Diagnosis not present

## 2019-07-18 DIAGNOSIS — M21261 Flexion deformity, right knee: Secondary | ICD-10-CM | POA: Diagnosis not present

## 2019-07-22 DIAGNOSIS — M21261 Flexion deformity, right knee: Secondary | ICD-10-CM | POA: Diagnosis not present

## 2019-07-22 DIAGNOSIS — M25551 Pain in right hip: Secondary | ICD-10-CM | POA: Diagnosis not present

## 2019-07-22 DIAGNOSIS — M25561 Pain in right knee: Secondary | ICD-10-CM | POA: Diagnosis not present

## 2019-07-25 DIAGNOSIS — M21261 Flexion deformity, right knee: Secondary | ICD-10-CM | POA: Diagnosis not present

## 2019-07-25 DIAGNOSIS — M25561 Pain in right knee: Secondary | ICD-10-CM | POA: Diagnosis not present

## 2019-07-25 DIAGNOSIS — M25551 Pain in right hip: Secondary | ICD-10-CM | POA: Diagnosis not present

## 2019-07-30 DIAGNOSIS — M25561 Pain in right knee: Secondary | ICD-10-CM | POA: Diagnosis not present

## 2019-07-30 DIAGNOSIS — M25551 Pain in right hip: Secondary | ICD-10-CM | POA: Diagnosis not present

## 2019-07-30 DIAGNOSIS — M21261 Flexion deformity, right knee: Secondary | ICD-10-CM | POA: Diagnosis not present

## 2019-08-01 DIAGNOSIS — M21261 Flexion deformity, right knee: Secondary | ICD-10-CM | POA: Diagnosis not present

## 2019-08-01 DIAGNOSIS — M25551 Pain in right hip: Secondary | ICD-10-CM | POA: Diagnosis not present

## 2019-08-01 DIAGNOSIS — M25561 Pain in right knee: Secondary | ICD-10-CM | POA: Diagnosis not present

## 2019-08-06 DIAGNOSIS — M25551 Pain in right hip: Secondary | ICD-10-CM | POA: Diagnosis not present

## 2019-08-06 DIAGNOSIS — M21261 Flexion deformity, right knee: Secondary | ICD-10-CM | POA: Diagnosis not present

## 2019-08-06 DIAGNOSIS — M25561 Pain in right knee: Secondary | ICD-10-CM | POA: Diagnosis not present

## 2019-08-08 DIAGNOSIS — M25551 Pain in right hip: Secondary | ICD-10-CM | POA: Diagnosis not present

## 2019-08-08 DIAGNOSIS — M25561 Pain in right knee: Secondary | ICD-10-CM | POA: Diagnosis not present

## 2019-08-08 DIAGNOSIS — M21261 Flexion deformity, right knee: Secondary | ICD-10-CM | POA: Diagnosis not present

## 2019-08-13 DIAGNOSIS — M21261 Flexion deformity, right knee: Secondary | ICD-10-CM | POA: Diagnosis not present

## 2019-08-13 DIAGNOSIS — M25551 Pain in right hip: Secondary | ICD-10-CM | POA: Diagnosis not present

## 2019-08-13 DIAGNOSIS — M25561 Pain in right knee: Secondary | ICD-10-CM | POA: Diagnosis not present

## 2019-08-20 DIAGNOSIS — M25551 Pain in right hip: Secondary | ICD-10-CM | POA: Diagnosis not present

## 2019-08-20 DIAGNOSIS — M25561 Pain in right knee: Secondary | ICD-10-CM | POA: Diagnosis not present

## 2019-08-20 DIAGNOSIS — M21261 Flexion deformity, right knee: Secondary | ICD-10-CM | POA: Diagnosis not present

## 2019-08-22 ENCOUNTER — Ambulatory Visit (INDEPENDENT_AMBULATORY_CARE_PROVIDER_SITE_OTHER): Payer: Medicare Other | Admitting: Orthopaedic Surgery

## 2019-08-22 ENCOUNTER — Other Ambulatory Visit: Payer: Self-pay

## 2019-08-22 ENCOUNTER — Ambulatory Visit (INDEPENDENT_AMBULATORY_CARE_PROVIDER_SITE_OTHER): Payer: Medicare Other

## 2019-08-22 ENCOUNTER — Encounter: Payer: Self-pay | Admitting: Orthopaedic Surgery

## 2019-08-22 DIAGNOSIS — Z96641 Presence of right artificial hip joint: Secondary | ICD-10-CM

## 2019-08-22 DIAGNOSIS — M25561 Pain in right knee: Secondary | ICD-10-CM | POA: Diagnosis not present

## 2019-08-22 DIAGNOSIS — M25551 Pain in right hip: Secondary | ICD-10-CM | POA: Diagnosis not present

## 2019-08-22 DIAGNOSIS — M21261 Flexion deformity, right knee: Secondary | ICD-10-CM | POA: Diagnosis not present

## 2019-08-22 NOTE — Progress Notes (Signed)
The patient is a very pleasant 54 year old gentleman who is now 8 months status post a right total hip arthroplasty.  He was severely deconditioned before this surgery.  He had a flexion contracture of his right knee to compensate for the pain from his right hip with a contracture of the right hip as well.  I follow along that he has had some type of neurologic underlying deficit but he has not had any type of work-up and it does not sound like he is ordered any type of work-up.  He reports that his hip is doing well overall and he has no pain and no issues.  He has been going through extensive outpatient physical therapy.  He still ambulates with a rolling walker.  He still has gait issues when I see him walk.  His gait is much improved though.  On exam he does not have the same contracture of his knee that he had before she has made great progress.  He tolerates me easily putting his right hip which is the operative hip the range of motion.  An AP pelvis and lateral right hip shows a well-seated implant with no complicating features.  Left hip has no acute findings.  At this point he will continue therapy for at least another 4 to 6 weeks to work on his gait.  There is nothing else that I have to offer from my standpoint in terms of any neurologic or underlying issues but from a hip standpoint he is doing so well the follow-up can be as needed at this point.  If there is any issues he will let us know and he is welcome to come back and see [place] for any musculoskeletal complaints or if his right hip starts giving him any issues or problems.

## 2019-08-27 DIAGNOSIS — M25551 Pain in right hip: Secondary | ICD-10-CM | POA: Diagnosis not present

## 2019-08-27 DIAGNOSIS — M21261 Flexion deformity, right knee: Secondary | ICD-10-CM | POA: Diagnosis not present

## 2019-08-27 DIAGNOSIS — M25561 Pain in right knee: Secondary | ICD-10-CM | POA: Diagnosis not present

## 2019-08-29 DIAGNOSIS — M25551 Pain in right hip: Secondary | ICD-10-CM | POA: Diagnosis not present

## 2019-08-29 DIAGNOSIS — M25561 Pain in right knee: Secondary | ICD-10-CM | POA: Diagnosis not present

## 2019-08-29 DIAGNOSIS — M21261 Flexion deformity, right knee: Secondary | ICD-10-CM | POA: Diagnosis not present

## 2019-09-04 DIAGNOSIS — M25561 Pain in right knee: Secondary | ICD-10-CM | POA: Diagnosis not present

## 2019-09-04 DIAGNOSIS — M21261 Flexion deformity, right knee: Secondary | ICD-10-CM | POA: Diagnosis not present

## 2019-09-04 DIAGNOSIS — M25551 Pain in right hip: Secondary | ICD-10-CM | POA: Diagnosis not present

## 2019-09-10 DIAGNOSIS — M25551 Pain in right hip: Secondary | ICD-10-CM | POA: Diagnosis not present

## 2019-09-10 DIAGNOSIS — M25561 Pain in right knee: Secondary | ICD-10-CM | POA: Diagnosis not present

## 2019-09-10 DIAGNOSIS — M21261 Flexion deformity, right knee: Secondary | ICD-10-CM | POA: Diagnosis not present

## 2019-09-11 IMAGING — RF OPERATIVE RIGHT HIP WITH PELVIS
1 series · 7 of 7 positions shown · non-contrast
Comparison: None.

CLINICAL DATA: Status post right hip replacement

EXAM:
OPERATIVE RIGHT HIP WITH PELVIS

[Series 1: unknown protocol · 0.20mm/px · 7 of 7 slices shown]
[im 1/7]
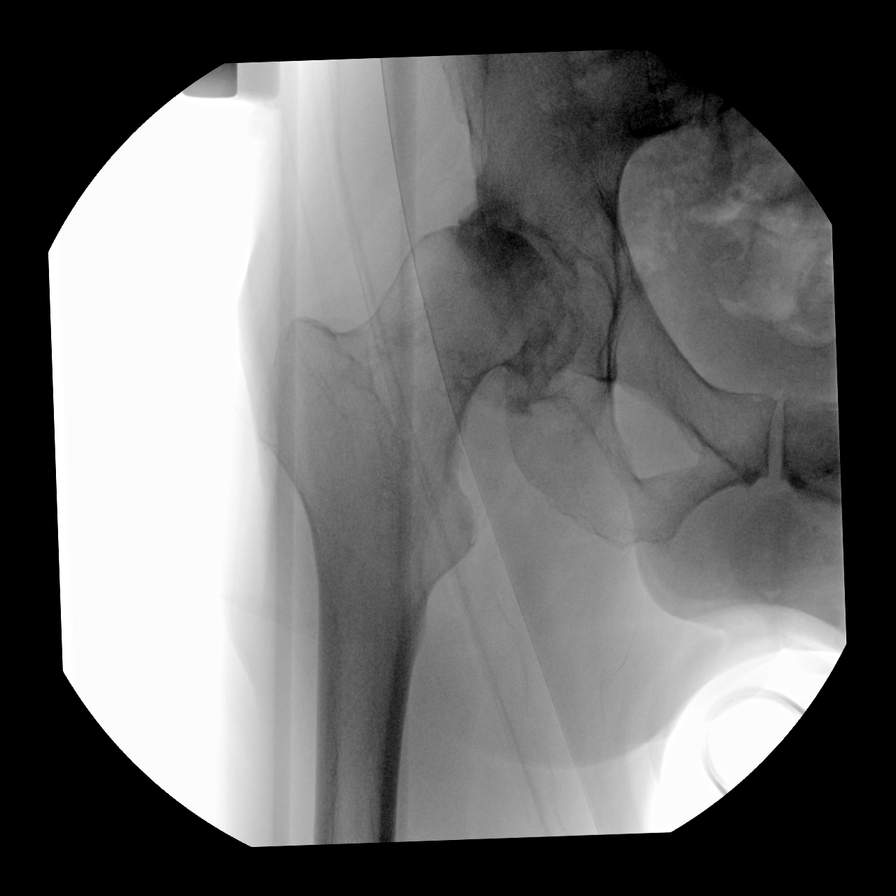
[im 2/7]
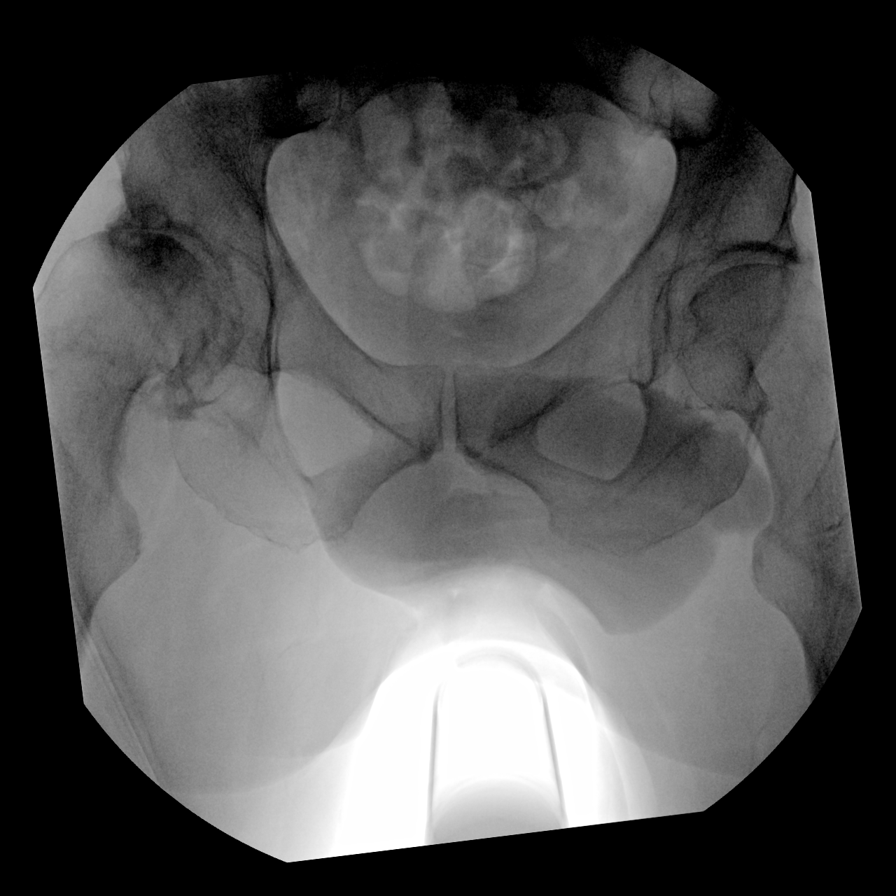
[im 3/7]
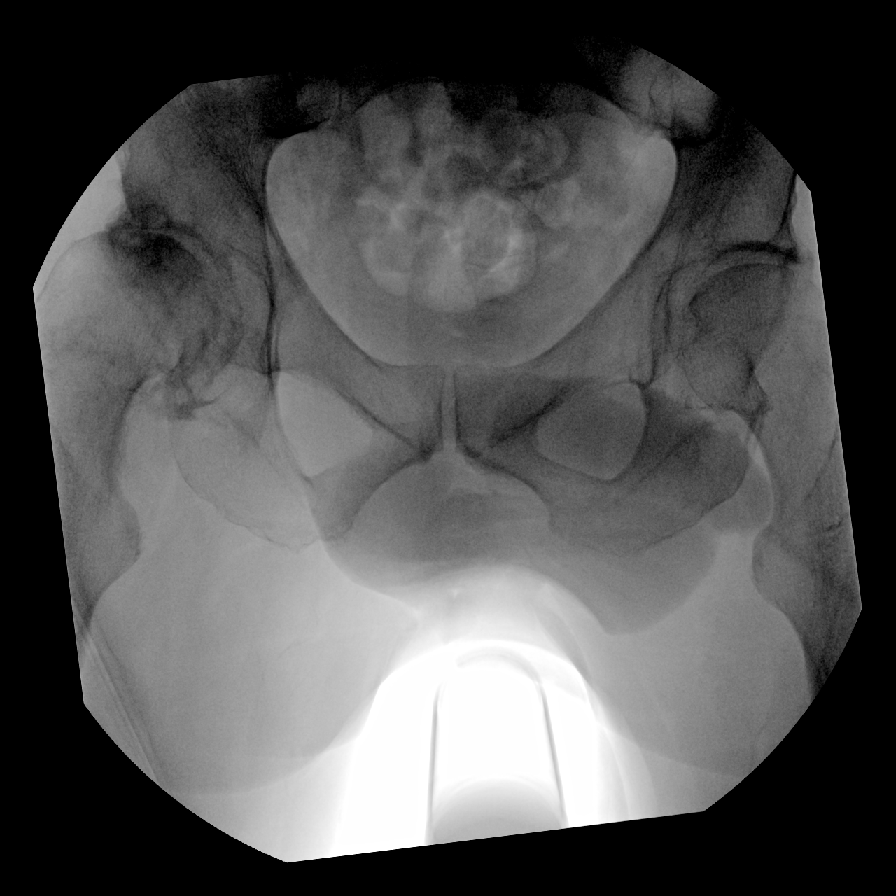
[im 4/7]
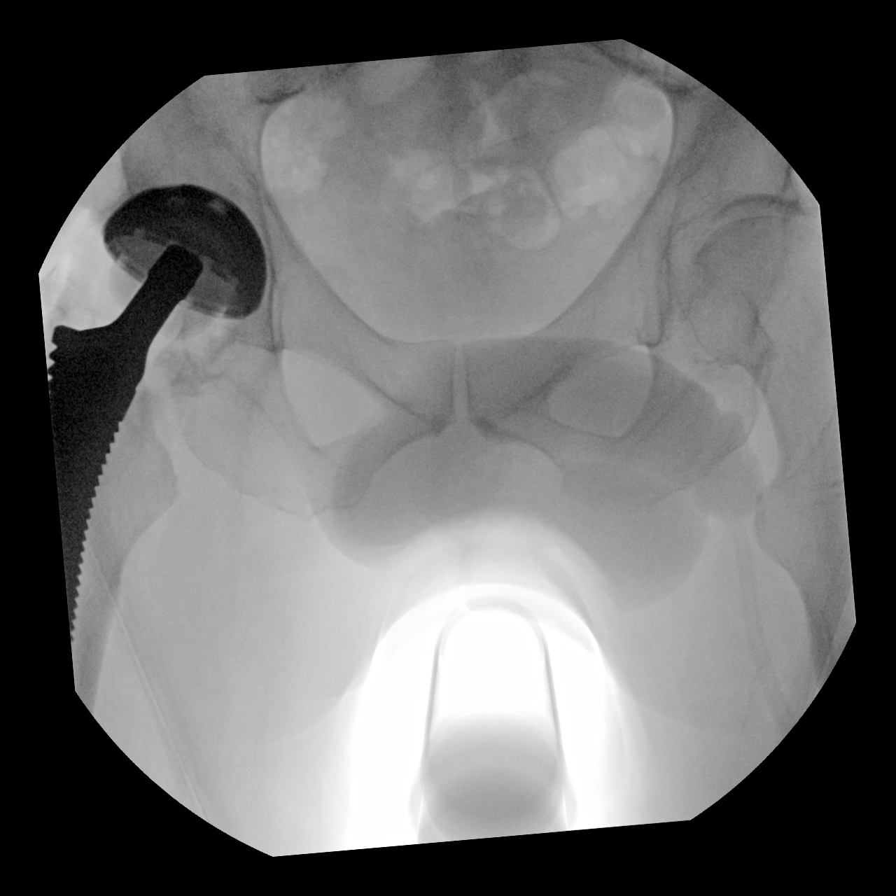
[im 5/7]
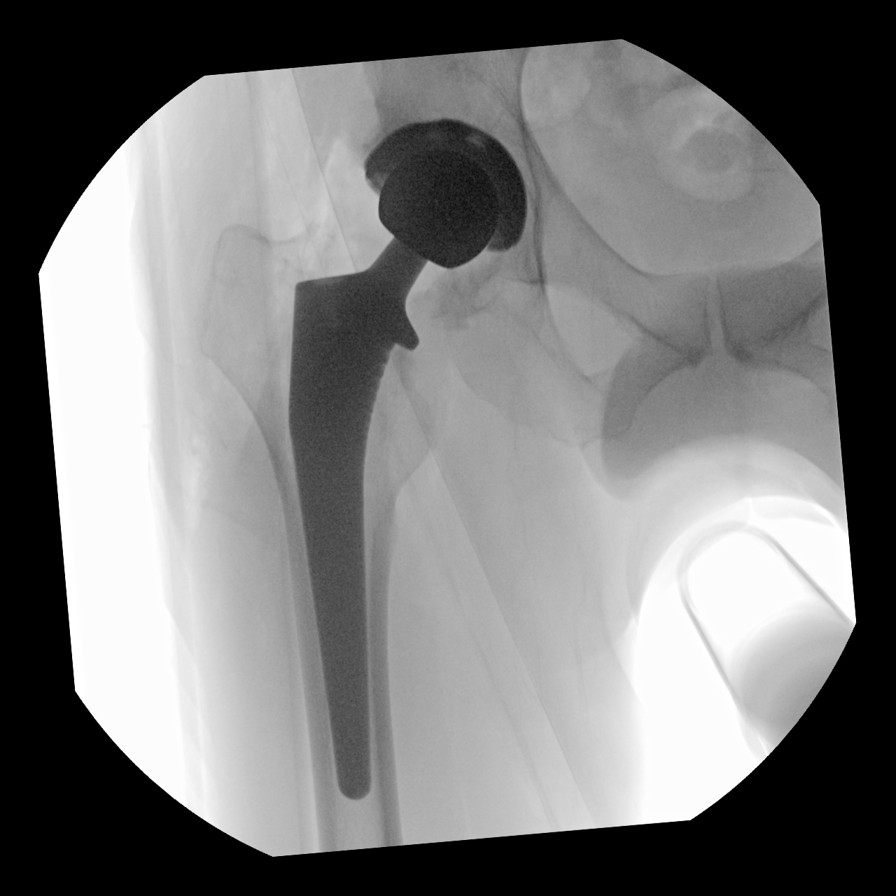
[im 6/7]
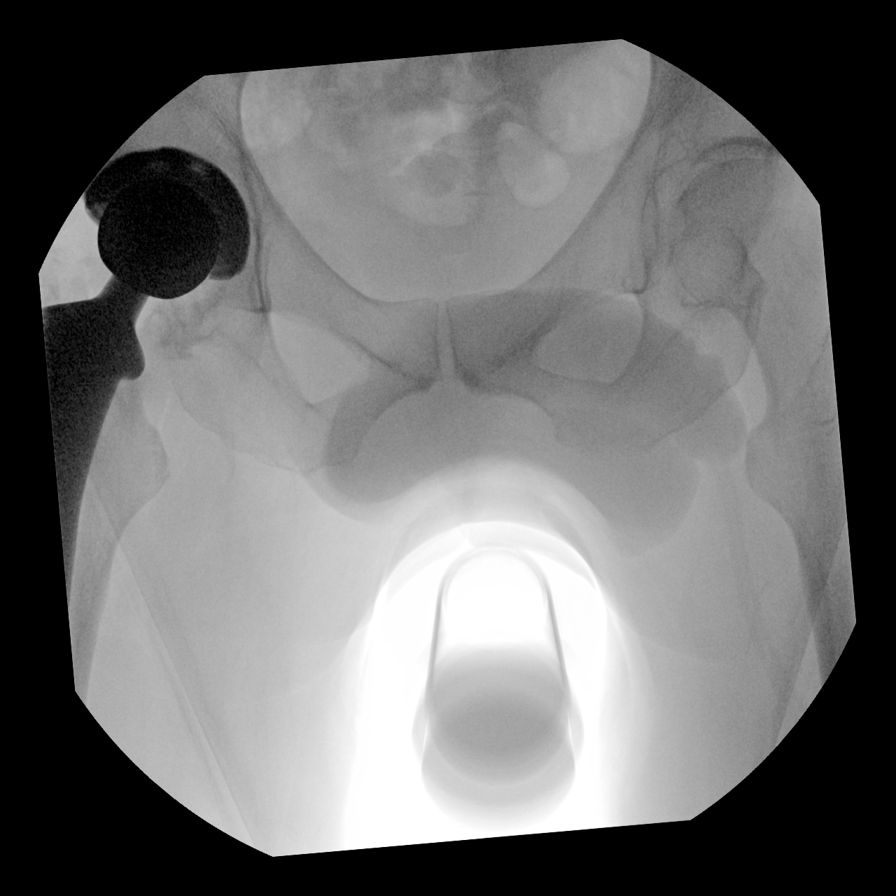
[im 7/7]
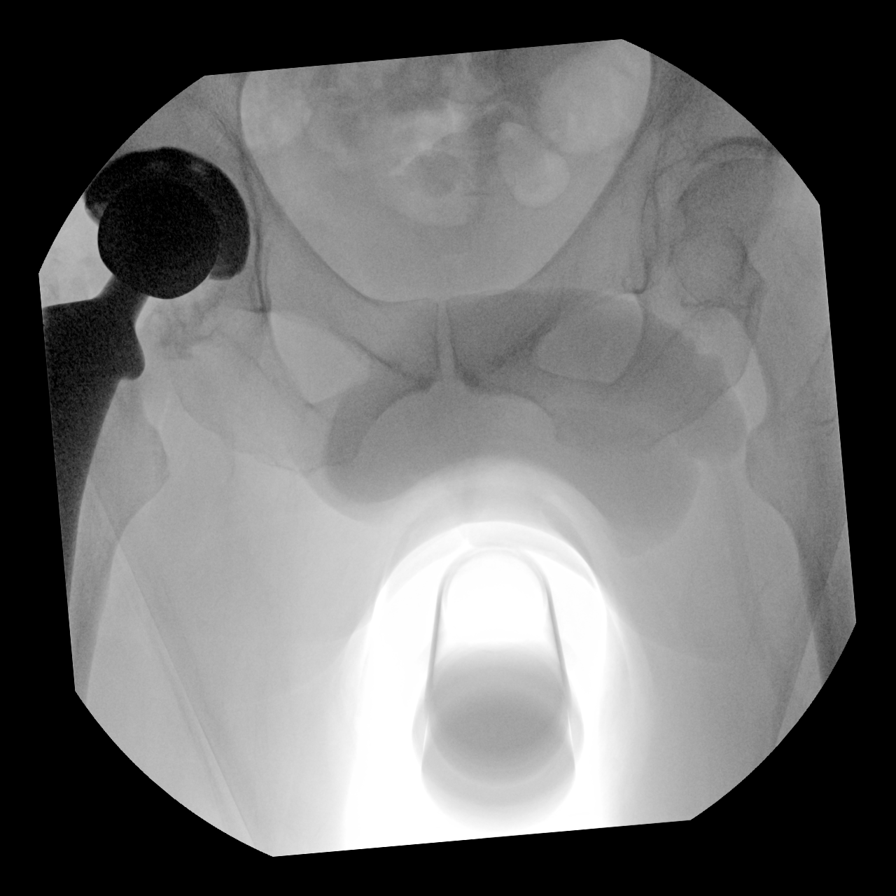

[7 of 7 positions shown; findings below may reference images not displayed]

FLUOROSCOPY TIME:  Radiation Exposure Index (as provided by the
fluoroscopic device): 2.49 mGy

If the device does not provide the exposure index:

Fluoroscopy Time:  19 seconds

Number of Acquired Images:  7
FINDINGS: Initial images again demonstrate severe degenerative change of the
right hip joint. Subsequent placement of right femoral prosthesis is
seen in satisfactory position. No acute abnormality is noted.
IMPRESSION: Status post right hip replacement.

## 2019-09-12 DIAGNOSIS — M25551 Pain in right hip: Secondary | ICD-10-CM | POA: Diagnosis not present

## 2019-09-12 DIAGNOSIS — M25561 Pain in right knee: Secondary | ICD-10-CM | POA: Diagnosis not present

## 2019-09-12 DIAGNOSIS — M21261 Flexion deformity, right knee: Secondary | ICD-10-CM | POA: Diagnosis not present

## 2019-09-16 DIAGNOSIS — M21261 Flexion deformity, right knee: Secondary | ICD-10-CM | POA: Diagnosis not present

## 2019-09-16 DIAGNOSIS — M25551 Pain in right hip: Secondary | ICD-10-CM | POA: Diagnosis not present

## 2019-09-16 DIAGNOSIS — M25561 Pain in right knee: Secondary | ICD-10-CM | POA: Diagnosis not present

## 2019-09-18 DIAGNOSIS — M25561 Pain in right knee: Secondary | ICD-10-CM | POA: Diagnosis not present

## 2019-09-18 DIAGNOSIS — M21261 Flexion deformity, right knee: Secondary | ICD-10-CM | POA: Diagnosis not present

## 2019-09-18 DIAGNOSIS — M25551 Pain in right hip: Secondary | ICD-10-CM | POA: Diagnosis not present

## 2019-09-23 DIAGNOSIS — M25561 Pain in right knee: Secondary | ICD-10-CM | POA: Diagnosis not present

## 2019-09-23 DIAGNOSIS — M21261 Flexion deformity, right knee: Secondary | ICD-10-CM | POA: Diagnosis not present

## 2019-09-23 DIAGNOSIS — M25551 Pain in right hip: Secondary | ICD-10-CM | POA: Diagnosis not present

## 2019-09-26 DIAGNOSIS — M21261 Flexion deformity, right knee: Secondary | ICD-10-CM | POA: Diagnosis not present

## 2019-09-26 DIAGNOSIS — M25561 Pain in right knee: Secondary | ICD-10-CM | POA: Diagnosis not present

## 2019-09-26 DIAGNOSIS — M25551 Pain in right hip: Secondary | ICD-10-CM | POA: Diagnosis not present

## 2019-09-30 DIAGNOSIS — M25551 Pain in right hip: Secondary | ICD-10-CM | POA: Diagnosis not present

## 2019-09-30 DIAGNOSIS — M25561 Pain in right knee: Secondary | ICD-10-CM | POA: Diagnosis not present

## 2019-09-30 DIAGNOSIS — M21261 Flexion deformity, right knee: Secondary | ICD-10-CM | POA: Diagnosis not present

## 2019-10-07 DIAGNOSIS — M25561 Pain in right knee: Secondary | ICD-10-CM | POA: Diagnosis not present

## 2019-10-07 DIAGNOSIS — M21261 Flexion deformity, right knee: Secondary | ICD-10-CM | POA: Diagnosis not present

## 2019-10-07 DIAGNOSIS — M25551 Pain in right hip: Secondary | ICD-10-CM | POA: Diagnosis not present

## 2019-10-09 DIAGNOSIS — M25551 Pain in right hip: Secondary | ICD-10-CM | POA: Diagnosis not present

## 2019-10-09 DIAGNOSIS — M21261 Flexion deformity, right knee: Secondary | ICD-10-CM | POA: Diagnosis not present

## 2019-10-09 DIAGNOSIS — M25561 Pain in right knee: Secondary | ICD-10-CM | POA: Diagnosis not present

## 2019-10-16 DIAGNOSIS — M25551 Pain in right hip: Secondary | ICD-10-CM | POA: Diagnosis not present

## 2019-10-16 DIAGNOSIS — M25561 Pain in right knee: Secondary | ICD-10-CM | POA: Diagnosis not present

## 2019-10-16 DIAGNOSIS — M21261 Flexion deformity, right knee: Secondary | ICD-10-CM | POA: Diagnosis not present

## 2019-10-17 DIAGNOSIS — M25551 Pain in right hip: Secondary | ICD-10-CM | POA: Diagnosis not present

## 2019-10-17 DIAGNOSIS — M21261 Flexion deformity, right knee: Secondary | ICD-10-CM | POA: Diagnosis not present

## 2019-10-17 DIAGNOSIS — M25561 Pain in right knee: Secondary | ICD-10-CM | POA: Diagnosis not present

## 2019-10-21 ENCOUNTER — Telehealth: Payer: Self-pay | Admitting: Orthopaedic Surgery

## 2019-10-21 NOTE — Telephone Encounter (Signed)
Erie Noe from [facility] called.   Following up on paperwork faxed on the patient's behalf.   Call back: [phone]

## 2019-10-22 NOTE — Telephone Encounter (Signed)
Seen this?

## 2019-10-23 NOTE — Telephone Encounter (Signed)
If I'm not mistaken, I think I placed it in his box yesterday.

## 2019-10-28 DIAGNOSIS — M21261 Flexion deformity, right knee: Secondary | ICD-10-CM | POA: Diagnosis not present

## 2019-10-28 DIAGNOSIS — M25551 Pain in right hip: Secondary | ICD-10-CM | POA: Diagnosis not present

## 2019-10-28 DIAGNOSIS — M25561 Pain in right knee: Secondary | ICD-10-CM | POA: Diagnosis not present

## 2019-11-28 DIAGNOSIS — M21261 Flexion deformity, right knee: Secondary | ICD-10-CM | POA: Diagnosis not present

## 2019-11-28 DIAGNOSIS — M25551 Pain in right hip: Secondary | ICD-10-CM | POA: Diagnosis not present

## 2019-11-28 DIAGNOSIS — M25561 Pain in right knee: Secondary | ICD-10-CM | POA: Diagnosis not present

## 2019-12-18 ENCOUNTER — Telehealth: Payer: Self-pay | Admitting: *Deleted

## 2019-12-18 DIAGNOSIS — M25561 Pain in right knee: Secondary | ICD-10-CM | POA: Diagnosis not present

## 2019-12-18 DIAGNOSIS — M21261 Flexion deformity, right knee: Secondary | ICD-10-CM | POA: Diagnosis not present

## 2019-12-18 DIAGNOSIS — M25551 Pain in right hip: Secondary | ICD-10-CM | POA: Diagnosis not present

## 2019-12-18 NOTE — Telephone Encounter (Signed)
Ortho bundle 1 year call completed. ?

## 2019-12-18 NOTE — Care Plan (Signed)
Telephone call to patient to check status at 1 year Post-op from R-THA per Dr. Magnus Ivan. Patient states the hip is doing well. He has been receiving OPPT for months now related to the contracture of the knee and antalgic gait. He reports overall, the hip is giving him no issues. See Darral Dash. Survey at 1 year. Ortho bundle calls completed.

## 2019-12-20 DIAGNOSIS — M25551 Pain in right hip: Secondary | ICD-10-CM | POA: Diagnosis not present

## 2019-12-20 DIAGNOSIS — M21261 Flexion deformity, right knee: Secondary | ICD-10-CM | POA: Diagnosis not present

## 2019-12-20 DIAGNOSIS — M25561 Pain in right knee: Secondary | ICD-10-CM | POA: Diagnosis not present

## 2019-12-23 DIAGNOSIS — M25551 Pain in right hip: Secondary | ICD-10-CM | POA: Diagnosis not present

## 2019-12-23 DIAGNOSIS — M21261 Flexion deformity, right knee: Secondary | ICD-10-CM | POA: Diagnosis not present

## 2019-12-23 DIAGNOSIS — M25561 Pain in right knee: Secondary | ICD-10-CM | POA: Diagnosis not present

## 2019-12-25 DIAGNOSIS — M25551 Pain in right hip: Secondary | ICD-10-CM | POA: Diagnosis not present

## 2019-12-25 DIAGNOSIS — M21261 Flexion deformity, right knee: Secondary | ICD-10-CM | POA: Diagnosis not present

## 2019-12-25 DIAGNOSIS — M25561 Pain in right knee: Secondary | ICD-10-CM | POA: Diagnosis not present

## 2019-12-31 DIAGNOSIS — M21261 Flexion deformity, right knee: Secondary | ICD-10-CM | POA: Diagnosis not present

## 2019-12-31 DIAGNOSIS — M25551 Pain in right hip: Secondary | ICD-10-CM | POA: Diagnosis not present

## 2019-12-31 DIAGNOSIS — M25561 Pain in right knee: Secondary | ICD-10-CM | POA: Diagnosis not present

## 2020-01-01 DIAGNOSIS — M21261 Flexion deformity, right knee: Secondary | ICD-10-CM | POA: Diagnosis not present

## 2020-01-01 DIAGNOSIS — M25551 Pain in right hip: Secondary | ICD-10-CM | POA: Diagnosis not present

## 2020-01-01 DIAGNOSIS — M25561 Pain in right knee: Secondary | ICD-10-CM | POA: Diagnosis not present

## 2020-01-06 DIAGNOSIS — M21261 Flexion deformity, right knee: Secondary | ICD-10-CM | POA: Diagnosis not present

## 2020-01-06 DIAGNOSIS — M25551 Pain in right hip: Secondary | ICD-10-CM | POA: Diagnosis not present

## 2020-01-06 DIAGNOSIS — M25561 Pain in right knee: Secondary | ICD-10-CM | POA: Diagnosis not present

## 2020-01-08 DIAGNOSIS — M25561 Pain in right knee: Secondary | ICD-10-CM | POA: Diagnosis not present

## 2020-01-08 DIAGNOSIS — M25551 Pain in right hip: Secondary | ICD-10-CM | POA: Diagnosis not present

## 2020-01-08 DIAGNOSIS — M21261 Flexion deformity, right knee: Secondary | ICD-10-CM | POA: Diagnosis not present

## 2020-01-13 DIAGNOSIS — M21261 Flexion deformity, right knee: Secondary | ICD-10-CM | POA: Diagnosis not present

## 2020-01-13 DIAGNOSIS — M25551 Pain in right hip: Secondary | ICD-10-CM | POA: Diagnosis not present

## 2020-01-13 DIAGNOSIS — M25561 Pain in right knee: Secondary | ICD-10-CM | POA: Diagnosis not present

## 2020-01-16 DIAGNOSIS — M25561 Pain in right knee: Secondary | ICD-10-CM | POA: Diagnosis not present

## 2020-01-16 DIAGNOSIS — M21261 Flexion deformity, right knee: Secondary | ICD-10-CM | POA: Diagnosis not present

## 2020-01-16 DIAGNOSIS — M25551 Pain in right hip: Secondary | ICD-10-CM | POA: Diagnosis not present

## 2020-02-03 DIAGNOSIS — M21261 Flexion deformity, right knee: Secondary | ICD-10-CM | POA: Diagnosis not present

## 2020-02-03 DIAGNOSIS — M25551 Pain in right hip: Secondary | ICD-10-CM | POA: Diagnosis not present

## 2020-02-03 DIAGNOSIS — M25561 Pain in right knee: Secondary | ICD-10-CM | POA: Diagnosis not present

## 2020-02-05 DIAGNOSIS — M25561 Pain in right knee: Secondary | ICD-10-CM | POA: Diagnosis not present

## 2020-02-05 DIAGNOSIS — M21261 Flexion deformity, right knee: Secondary | ICD-10-CM | POA: Diagnosis not present

## 2020-02-05 DIAGNOSIS — M25551 Pain in right hip: Secondary | ICD-10-CM | POA: Diagnosis not present

## 2020-02-11 DIAGNOSIS — M25561 Pain in right knee: Secondary | ICD-10-CM | POA: Diagnosis not present

## 2020-02-11 DIAGNOSIS — M25551 Pain in right hip: Secondary | ICD-10-CM | POA: Diagnosis not present

## 2020-02-11 DIAGNOSIS — M21261 Flexion deformity, right knee: Secondary | ICD-10-CM | POA: Diagnosis not present

## 2020-02-13 DIAGNOSIS — M25561 Pain in right knee: Secondary | ICD-10-CM | POA: Diagnosis not present

## 2020-02-13 DIAGNOSIS — M25551 Pain in right hip: Secondary | ICD-10-CM | POA: Diagnosis not present

## 2020-02-13 DIAGNOSIS — M21261 Flexion deformity, right knee: Secondary | ICD-10-CM | POA: Diagnosis not present

## 2020-02-18 DIAGNOSIS — M21261 Flexion deformity, right knee: Secondary | ICD-10-CM | POA: Diagnosis not present

## 2020-02-18 DIAGNOSIS — M25561 Pain in right knee: Secondary | ICD-10-CM | POA: Diagnosis not present

## 2020-02-18 DIAGNOSIS — M25551 Pain in right hip: Secondary | ICD-10-CM | POA: Diagnosis not present

## 2020-02-19 DIAGNOSIS — M1710 Unilateral primary osteoarthritis, unspecified knee: Secondary | ICD-10-CM | POA: Diagnosis not present

## 2020-02-19 DIAGNOSIS — M25561 Pain in right knee: Secondary | ICD-10-CM | POA: Diagnosis not present

## 2020-02-19 DIAGNOSIS — Z299 Encounter for prophylactic measures, unspecified: Secondary | ICD-10-CM | POA: Diagnosis not present

## 2020-02-19 DIAGNOSIS — J449 Chronic obstructive pulmonary disease, unspecified: Secondary | ICD-10-CM | POA: Diagnosis not present

## 2020-02-20 DIAGNOSIS — M21261 Flexion deformity, right knee: Secondary | ICD-10-CM | POA: Diagnosis not present

## 2020-02-20 DIAGNOSIS — M25561 Pain in right knee: Secondary | ICD-10-CM | POA: Diagnosis not present

## 2020-02-20 DIAGNOSIS — M25551 Pain in right hip: Secondary | ICD-10-CM | POA: Diagnosis not present

## 2020-02-25 DIAGNOSIS — M21261 Flexion deformity, right knee: Secondary | ICD-10-CM | POA: Diagnosis not present

## 2020-02-25 DIAGNOSIS — M25561 Pain in right knee: Secondary | ICD-10-CM | POA: Diagnosis not present

## 2020-02-25 DIAGNOSIS — M25551 Pain in right hip: Secondary | ICD-10-CM | POA: Diagnosis not present

## 2020-02-27 DIAGNOSIS — M25561 Pain in right knee: Secondary | ICD-10-CM | POA: Diagnosis not present

## 2020-02-27 DIAGNOSIS — M21261 Flexion deformity, right knee: Secondary | ICD-10-CM | POA: Diagnosis not present

## 2020-02-27 DIAGNOSIS — M25551 Pain in right hip: Secondary | ICD-10-CM | POA: Diagnosis not present

## 2020-03-25 DIAGNOSIS — Z23 Encounter for immunization: Secondary | ICD-10-CM | POA: Diagnosis not present

## 2020-04-20 DIAGNOSIS — Z23 Encounter for immunization: Secondary | ICD-10-CM | POA: Diagnosis not present

## 2020-04-20 DIAGNOSIS — Z1339 Encounter for screening examination for other mental health and behavioral disorders: Secondary | ICD-10-CM | POA: Diagnosis not present

## 2020-04-20 DIAGNOSIS — Z0000 Encounter for general adult medical examination without abnormal findings: Secondary | ICD-10-CM | POA: Diagnosis not present

## 2020-04-20 DIAGNOSIS — Z6827 Body mass index (BMI) 27.0-27.9, adult: Secondary | ICD-10-CM | POA: Diagnosis not present

## 2020-04-20 DIAGNOSIS — Z79899 Other long term (current) drug therapy: Secondary | ICD-10-CM | POA: Diagnosis not present

## 2020-04-20 DIAGNOSIS — Z125 Encounter for screening for malignant neoplasm of prostate: Secondary | ICD-10-CM | POA: Diagnosis not present

## 2020-04-20 DIAGNOSIS — R5383 Other fatigue: Secondary | ICD-10-CM | POA: Diagnosis not present

## 2020-04-20 DIAGNOSIS — Z1331 Encounter for screening for depression: Secondary | ICD-10-CM | POA: Diagnosis not present

## 2020-04-20 DIAGNOSIS — Z299 Encounter for prophylactic measures, unspecified: Secondary | ICD-10-CM | POA: Diagnosis not present

## 2020-04-20 DIAGNOSIS — Z7189 Other specified counseling: Secondary | ICD-10-CM | POA: Diagnosis not present

## 2020-04-20 DIAGNOSIS — Z789 Other specified health status: Secondary | ICD-10-CM | POA: Diagnosis not present

## 2020-04-20 DIAGNOSIS — E7800 Pure hypercholesterolemia, unspecified: Secondary | ICD-10-CM | POA: Diagnosis not present

## 2020-08-03 DIAGNOSIS — Z789 Other specified health status: Secondary | ICD-10-CM | POA: Diagnosis not present

## 2020-08-03 DIAGNOSIS — Z299 Encounter for prophylactic measures, unspecified: Secondary | ICD-10-CM | POA: Diagnosis not present

## 2020-08-03 DIAGNOSIS — J449 Chronic obstructive pulmonary disease, unspecified: Secondary | ICD-10-CM | POA: Diagnosis not present

## 2020-08-17 DIAGNOSIS — Z23 Encounter for immunization: Secondary | ICD-10-CM | POA: Diagnosis not present

## 2020-09-23 DIAGNOSIS — N3289 Other specified disorders of bladder: Secondary | ICD-10-CM | POA: Diagnosis not present

## 2020-09-23 DIAGNOSIS — I700 Atherosclerosis of aorta: Secondary | ICD-10-CM | POA: Diagnosis not present

## 2020-09-23 DIAGNOSIS — R112 Nausea with vomiting, unspecified: Secondary | ICD-10-CM | POA: Diagnosis not present

## 2020-09-23 DIAGNOSIS — R1012 Left upper quadrant pain: Secondary | ICD-10-CM | POA: Diagnosis not present

## 2020-09-23 DIAGNOSIS — K3189 Other diseases of stomach and duodenum: Secondary | ICD-10-CM | POA: Diagnosis not present

## 2020-09-23 DIAGNOSIS — Q433 Congenital malformations of intestinal fixation: Secondary | ICD-10-CM | POA: Diagnosis not present

## 2020-09-23 DIAGNOSIS — K429 Umbilical hernia without obstruction or gangrene: Secondary | ICD-10-CM | POA: Diagnosis not present

## 2020-09-23 DIAGNOSIS — R1013 Epigastric pain: Secondary | ICD-10-CM | POA: Diagnosis not present

## 2020-09-25 DIAGNOSIS — J449 Chronic obstructive pulmonary disease, unspecified: Secondary | ICD-10-CM | POA: Diagnosis not present

## 2020-09-25 DIAGNOSIS — K5900 Constipation, unspecified: Secondary | ICD-10-CM | POA: Diagnosis not present

## 2020-09-25 DIAGNOSIS — I700 Atherosclerosis of aorta: Secondary | ICD-10-CM | POA: Diagnosis not present

## 2020-09-25 DIAGNOSIS — Z299 Encounter for prophylactic measures, unspecified: Secondary | ICD-10-CM | POA: Diagnosis not present

## 2020-09-25 DIAGNOSIS — K2970 Gastritis, unspecified, without bleeding: Secondary | ICD-10-CM | POA: Diagnosis not present

## 2021-02-18 DIAGNOSIS — Z20822 Contact with and (suspected) exposure to covid-19: Secondary | ICD-10-CM | POA: Diagnosis not present

## 2021-03-05 DIAGNOSIS — Z23 Encounter for immunization: Secondary | ICD-10-CM | POA: Diagnosis not present

## 2021-04-22 DIAGNOSIS — Z299 Encounter for prophylactic measures, unspecified: Secondary | ICD-10-CM | POA: Diagnosis not present

## 2021-04-22 DIAGNOSIS — Z1331 Encounter for screening for depression: Secondary | ICD-10-CM | POA: Diagnosis not present

## 2021-04-22 DIAGNOSIS — Z789 Other specified health status: Secondary | ICD-10-CM | POA: Diagnosis not present

## 2021-04-22 DIAGNOSIS — Z6827 Body mass index (BMI) 27.0-27.9, adult: Secondary | ICD-10-CM | POA: Diagnosis not present

## 2021-04-22 DIAGNOSIS — Z125 Encounter for screening for malignant neoplasm of prostate: Secondary | ICD-10-CM | POA: Diagnosis not present

## 2021-04-22 DIAGNOSIS — Z0000 Encounter for general adult medical examination without abnormal findings: Secondary | ICD-10-CM | POA: Diagnosis not present

## 2021-04-22 DIAGNOSIS — R5383 Other fatigue: Secondary | ICD-10-CM | POA: Diagnosis not present

## 2021-04-22 DIAGNOSIS — Z1339 Encounter for screening examination for other mental health and behavioral disorders: Secondary | ICD-10-CM | POA: Diagnosis not present

## 2021-04-22 DIAGNOSIS — Z7189 Other specified counseling: Secondary | ICD-10-CM | POA: Diagnosis not present

## 2021-04-22 DIAGNOSIS — Z79899 Other long term (current) drug therapy: Secondary | ICD-10-CM | POA: Diagnosis not present

## 2021-04-22 DIAGNOSIS — E7800 Pure hypercholesterolemia, unspecified: Secondary | ICD-10-CM | POA: Diagnosis not present

## 2021-06-29 DIAGNOSIS — I700 Atherosclerosis of aorta: Secondary | ICD-10-CM | POA: Diagnosis not present

## 2021-06-29 DIAGNOSIS — Z299 Encounter for prophylactic measures, unspecified: Secondary | ICD-10-CM | POA: Diagnosis not present

## 2021-06-29 DIAGNOSIS — E7800 Pure hypercholesterolemia, unspecified: Secondary | ICD-10-CM | POA: Diagnosis not present

## 2021-06-29 DIAGNOSIS — J449 Chronic obstructive pulmonary disease, unspecified: Secondary | ICD-10-CM | POA: Diagnosis not present

## 2022-04-28 DIAGNOSIS — E7800 Pure hypercholesterolemia, unspecified: Secondary | ICD-10-CM | POA: Diagnosis not present

## 2022-04-28 DIAGNOSIS — Z7189 Other specified counseling: Secondary | ICD-10-CM | POA: Diagnosis not present

## 2022-04-28 DIAGNOSIS — Z79899 Other long term (current) drug therapy: Secondary | ICD-10-CM | POA: Diagnosis not present

## 2022-04-28 DIAGNOSIS — Z0000 Encounter for general adult medical examination without abnormal findings: Secondary | ICD-10-CM | POA: Diagnosis not present

## 2022-04-28 DIAGNOSIS — Z789 Other specified health status: Secondary | ICD-10-CM | POA: Diagnosis not present

## 2022-04-28 DIAGNOSIS — Z299 Encounter for prophylactic measures, unspecified: Secondary | ICD-10-CM | POA: Diagnosis not present

## 2022-04-28 DIAGNOSIS — Z1211 Encounter for screening for malignant neoplasm of colon: Secondary | ICD-10-CM | POA: Diagnosis not present

## 2022-04-28 DIAGNOSIS — R5383 Other fatigue: Secondary | ICD-10-CM | POA: Diagnosis not present

## 2022-10-13 DIAGNOSIS — Z7189 Other specified counseling: Secondary | ICD-10-CM | POA: Diagnosis not present

## 2022-10-13 DIAGNOSIS — I700 Atherosclerosis of aorta: Secondary | ICD-10-CM | POA: Diagnosis not present

## 2022-10-13 DIAGNOSIS — J449 Chronic obstructive pulmonary disease, unspecified: Secondary | ICD-10-CM | POA: Diagnosis not present

## 2022-10-13 DIAGNOSIS — Z0000 Encounter for general adult medical examination without abnormal findings: Secondary | ICD-10-CM | POA: Diagnosis not present

## 2022-10-13 DIAGNOSIS — Z299 Encounter for prophylactic measures, unspecified: Secondary | ICD-10-CM | POA: Diagnosis not present

## 2022-10-13 DIAGNOSIS — I10 Essential (primary) hypertension: Secondary | ICD-10-CM | POA: Diagnosis not present

## 2023-01-11 DIAGNOSIS — R5383 Other fatigue: Secondary | ICD-10-CM | POA: Diagnosis not present

## 2023-01-11 DIAGNOSIS — Z79899 Other long term (current) drug therapy: Secondary | ICD-10-CM | POA: Diagnosis not present

## 2023-01-11 DIAGNOSIS — Z125 Encounter for screening for malignant neoplasm of prostate: Secondary | ICD-10-CM | POA: Diagnosis not present

## 2023-01-11 DIAGNOSIS — E7800 Pure hypercholesterolemia, unspecified: Secondary | ICD-10-CM | POA: Diagnosis not present

## 2023-01-11 DIAGNOSIS — Z0000 Encounter for general adult medical examination without abnormal findings: Secondary | ICD-10-CM | POA: Diagnosis not present

## 2023-01-11 DIAGNOSIS — Z299 Encounter for prophylactic measures, unspecified: Secondary | ICD-10-CM | POA: Diagnosis not present

## 2023-07-13 DIAGNOSIS — J449 Chronic obstructive pulmonary disease, unspecified: Secondary | ICD-10-CM | POA: Diagnosis not present

## 2023-07-13 DIAGNOSIS — R279 Unspecified lack of coordination: Secondary | ICD-10-CM | POA: Diagnosis not present

## 2023-07-13 DIAGNOSIS — I700 Atherosclerosis of aorta: Secondary | ICD-10-CM | POA: Diagnosis not present

## 2023-07-13 DIAGNOSIS — Z299 Encounter for prophylactic measures, unspecified: Secondary | ICD-10-CM | POA: Diagnosis not present

## 2023-10-16 DIAGNOSIS — Z7189 Other specified counseling: Secondary | ICD-10-CM | POA: Diagnosis not present

## 2023-10-16 DIAGNOSIS — Z0000 Encounter for general adult medical examination without abnormal findings: Secondary | ICD-10-CM | POA: Diagnosis not present

## 2023-10-16 DIAGNOSIS — R5383 Other fatigue: Secondary | ICD-10-CM | POA: Diagnosis not present

## 2023-10-16 DIAGNOSIS — Z299 Encounter for prophylactic measures, unspecified: Secondary | ICD-10-CM | POA: Diagnosis not present
